# Patient Record
Sex: Female | Born: 1985 | Race: White | Hispanic: Refuse to answer | Marital: Married | Smoking: Never smoker
Health system: Southern US, Community
[De-identification: ages and names within clinical notes are randomized; demographics above are authoritative.]

## PROBLEM LIST (undated history)

## (undated) DIAGNOSIS — R87629 Unspecified abnormal cytological findings in specimens from vagina: Secondary | ICD-10-CM

## (undated) HISTORY — DX: Unspecified abnormal cytological findings in specimens from vagina: R87.629

---

## 2015-05-11 ENCOUNTER — Ambulatory Visit (INDEPENDENT_AMBULATORY_CARE_PROVIDER_SITE_OTHER): Payer: BLUE CROSS/BLUE SHIELD | Admitting: Adult Health

## 2015-05-11 ENCOUNTER — Encounter: Payer: Self-pay | Admitting: Adult Health

## 2015-05-11 ENCOUNTER — Other Ambulatory Visit (HOSPITAL_COMMUNITY)
Admission: RE | Admit: 2015-05-11 | Discharge: 2015-05-11 | Disposition: A | Discharge status: Home / Self Care | Payer: BLUE CROSS/BLUE SHIELD | Source: Ambulatory Visit | Attending: Adult Health | Admitting: Adult Health

## 2015-05-11 VITALS — BP 108/80 | HR 68 | Ht 64.0 in | Wt 136.0 lb

## 2015-05-11 DIAGNOSIS — Z1151 Encounter for screening for human papillomavirus (HPV): Secondary | ICD-10-CM | POA: Insufficient documentation

## 2015-05-11 DIAGNOSIS — Z01411 Encounter for gynecological examination (general) (routine) with abnormal findings: Secondary | ICD-10-CM | POA: Insufficient documentation

## 2015-05-11 DIAGNOSIS — Z01419 Encounter for gynecological examination (general) (routine) without abnormal findings: Secondary | ICD-10-CM | POA: Diagnosis not present

## 2015-05-11 NOTE — Patient Instructions (Signed)
Physical in 1 year Take PNV with folic acid

## 2015-05-11 NOTE — Progress Notes (Signed)
Patient ID: Marcello MooresCasey Severt, female   DOB: 01/21/1986, 30 y.o.   MRN: [id number] History of Present Illness: [name] is a 30 year old white female, married, G0P0, in for a well woman gyn exam and pap.She is not using birth control at present and says it is OK if gets pregnant. She is new to this practice and does not have PCP.  Current Medications, Allergies, Past Medical History, Past Surgical History, Family History and Social History were reviewed in [name] electronic medical record.     Review of Systems: Patient denies any headaches, hearing loss, fatigue, blurred vision, shortness of breath, chest pain, abdominal pain, problems with bowel movements, urination, or intercourse. No joint pain or mood swings.    Physical Exam:BP 108/80 mmHg  Pulse 68  Ht 5\' 4"  (1.626 m)  Wt 136 lb (61.689 kg)  BMI 23.33 kg/m2  LMP 04/20/2015 General:  Well developed, well nourished, no acute distress Skin:  Warm and dry Neck:  Midline trachea, normal thyroid, good ROM, no lymphadenopathy Lungs; Clear to auscultation bilaterally Breast:  No dominant palpable mass, retraction, or nipple discharge Cardiovascular: Regular rate and rhythm Abdomen:  Soft, non tender, no hepatosplenomegaly Pelvic:  External genitalia is normal in appearance, no lesions.  The vagina is normal in appearance. Urethra has no lesions or masses. The cervix is tiny, everted at os, Pap with HPV performed.  Uterus is felt to be normal size, shape, and contour.  No adnexal masses or tenderness noted.Bladder is non tender, no masses felt. Extremities/musculoskeletal:  No swelling or varicosities noted, no clubbing or cyanosis Psych:  No mood changes, alert and cooperative,seems happy She had labs at work.   Impression: Well woman gyn exam and pap     Plan:  Take folic acid 800 mcg daily Physical in 1 year, pap in 3 if normal, call with any questions or concerns before if needed Mammogram at 40

## 2015-05-12 LAB — CYTOLOGY - PAP

## 2016-05-15 ENCOUNTER — Encounter: Payer: Self-pay | Admitting: Adult Health

## 2016-05-15 ENCOUNTER — Ambulatory Visit (INDEPENDENT_AMBULATORY_CARE_PROVIDER_SITE_OTHER): Payer: BLUE CROSS/BLUE SHIELD | Admitting: Adult Health

## 2016-05-15 VITALS — BP 100/62 | HR 76 | Ht 64.0 in | Wt 137.0 lb

## 2016-05-15 DIAGNOSIS — Z13220 Encounter for screening for lipoid disorders: Secondary | ICD-10-CM | POA: Diagnosis not present

## 2016-05-15 DIAGNOSIS — Z01419 Encounter for gynecological examination (general) (routine) without abnormal findings: Secondary | ICD-10-CM

## 2016-05-15 MED ORDER — PRENATAL VITAMINS 0.8 MG PO TABS: 1.0000 | ORAL_TABLET | Freq: Every day | ORAL | 12 refills | Status: DC

## 2016-05-15 NOTE — Progress Notes (Signed)
Patient ID: Katherine Miles, female   DOB: [birth year], 31 y.o.   MRN: [id number] History of Present Illness:  Katherine Miles is a 31 year old white female, married in for well woman gyn exam, she had a normal pap with negative HPV 05/11/15.  Current Medications, Allergies, Past Medical History, Past Surgical History, Family History and Social History were reviewed in [facility] record.     Review of Systems: Patient denies any headaches, hearing loss, fatigue, blurred vision, shortness of breath, chest pain, abdominal pain, problems with bowel movements, urination, or intercourse. No joint pain or mood swings.She is not using birthcontrol and is OK if gets pregnant, is on OTC PNV.    Physical Exam:BP 100/62 (BP Location: Right Arm, Patient Position: Sitting, Cuff Size: Normal)   Pulse 76   Ht  (1.626 m)   Wt 137 lb (62.1 kg)   LMP 04/21/2016   BMI 23.52 kg/m  General:  Well developed, well nourished, no acute distress Skin:  Warm and dry Neck:  Midline trachea, normal thyroid, good ROM, no lymphadenopathy Lungs; Clear to auscultation bilaterally Breast:  No dominant palpable mass, retraction, or nipple discharge Cardiovascular: Regular rate and rhythm Abdomen:  Soft, non tender, no hepatosplenomegaly Pelvic:  External genitalia is normal in appearance, no lesions.  The vagina is normal in appearance. Urethra has no lesions or masses. The cervix is smooth.  Uterus is felt to be normal size, shape, and contour.  No adnexal masses or tenderness noted.Bladder is non tender, no masses felt. Extremities/musculoskeletal:  No swelling or varicosities noted, no clubbing or cyanosis Psych:  No mood changes, alert and cooperative,seems happy PHQ 2 score 0.  Impression:  1. Well woman exam with routine gynecological exam   2. Screening cholesterol level      Plan: Check CBC,CMP,TSH and lipids Physical in 1 year Pap in 2020

## 2016-05-16 LAB — CBC
Hematocrit: 40.6 % (ref 34.0–46.6)
Hemoglobin: 13.5 g/dL (ref 11.1–15.9)
MCH: 30.1 pg (ref 26.6–33.0)
MCHC: 33.3 g/dL (ref 31.5–35.7)
MCV: 90 fL (ref 79–97)
PLATELETS: 231 10*3/uL (ref 150–379)
RBC: 4.49 x10E6/uL (ref 3.77–5.28)
RDW: 13.1 % (ref 12.3–15.4)
WBC: 6.4 10*3/uL (ref 3.4–10.8)

## 2016-05-16 LAB — COMPREHENSIVE METABOLIC PANEL
A/G RATIO: 1.7 (ref 1.2–2.2)
ALK PHOS: 67 IU/L (ref 39–117)
ALT: 15 IU/L (ref 0–32)
AST: 18 IU/L (ref 0–40)
Albumin: 4.8 g/dL (ref 3.5–5.5)
BILIRUBIN TOTAL: 0.2 mg/dL (ref 0.0–1.2)
BUN/Creatinine Ratio: 20 (ref 9–23)
BUN: 15 mg/dL (ref 6–20)
CALCIUM: 9.9 mg/dL (ref 8.7–10.2)
CHLORIDE: 99 mmol/L (ref 96–106)
CO2: 24 mmol/L (ref 18–29)
Creatinine, Ser: 0.74 mg/dL (ref 0.57–1.00)
GFR calc Af Amer: 125 mL/min/{1.73_m2} (ref >59)
GFR calc non Af Amer: 108 mL/min/{1.73_m2} (ref >59)
Globulin, Total: 2.8 g/dL (ref 1.5–4.5)
Glucose: 85 mg/dL (ref 65–99)
POTASSIUM: 5.1 mmol/L (ref 3.5–5.2)
SODIUM: 140 mmol/L (ref 134–144)
Total Protein: 7.6 g/dL (ref 6.0–8.5)

## 2016-05-16 LAB — LIPID PANEL
CHOL/HDL RATIO: 2.2 ratio (ref 0.0–4.4)
CHOLESTEROL TOTAL: 199 mg/dL (ref 100–199)
HDL: 90 mg/dL (ref >39)
LDL CALC: 100 mg/dL — AB (ref 0–99)
Triglycerides: 47 mg/dL (ref 0–149)
VLDL CHOLESTEROL CAL: 9 mg/dL (ref 5–40)

## 2016-05-16 LAB — TSH: TSH: 1.67 u[IU]/mL (ref 0.450–4.500)

## 2016-05-29 ENCOUNTER — Telehealth: Payer: Self-pay | Admitting: *Deleted

## 2017-09-03 ENCOUNTER — Encounter: Payer: Self-pay | Admitting: Adult Health

## 2017-09-03 ENCOUNTER — Ambulatory Visit (INDEPENDENT_AMBULATORY_CARE_PROVIDER_SITE_OTHER): Payer: BLUE CROSS/BLUE SHIELD | Admitting: Adult Health

## 2017-09-03 VITALS — BP 113/67 | HR 68 | Ht 65.0 in | Wt 133.4 lb

## 2017-09-03 DIAGNOSIS — Z01419 Encounter for gynecological examination (general) (routine) without abnormal findings: Secondary | ICD-10-CM | POA: Diagnosis not present

## 2017-09-03 DIAGNOSIS — Z13220 Encounter for screening for lipoid disorders: Secondary | ICD-10-CM | POA: Insufficient documentation

## 2017-09-03 NOTE — Progress Notes (Signed)
Patient ID: Katherine Miles, female   DOB: 08/19/1985, 32 y.o.   MRN: [id number] History of Present Illness: Katherine Miles is a 32 year old white female,married in for well woman gyn exam, she had normal pap with negative HPV 05/11/15.   Current Medications, Allergies, Past Medical History, Past Surgical History, Family History and Social History were reviewed in [name] electronic medical record.     Review of Systems:  Patient denies any headaches, hearing loss, fatigue, blurred vision, shortness of breath, chest pain, abdominal pain, problems with bowel movements, urination, or intercourse. No joint pain or mood swings. She is not on birth control and if gets pregnant it is Ok, discussed timing of sex, but she would like to adopt.   Physical Exam:BP 113/67 (BP Location: Right Arm, Patient Position: Sitting, Cuff Size: Normal)   Pulse 68   Ht 5\' 5"  (1.651 m)   Wt 133 lb 6.4 oz (60.5 kg)   LMP 09/01/2017 (Exact Date)   BMI 22.20 kg/m  General:  Well developed, well nourished, no acute distress Skin:  Warm and dry Neck:  Midline trachea, normal thyroid, good ROM, no lymphadenopathy Lungs; Clear to auscultation bilaterally Breast:  No dominant palpable mass, retraction, or nipple discharge Cardiovascular: Regular rate and rhythm Abdomen:  Soft, non tender, no hepatosplenomegaly Pelvic:  External genitalia is normal in appearance, no lesions.  The vagina is normal in appearance,+blood. Urethra has no lesions or masses. The cervix is smooth.  Uterus is felt to be normal size, shape, and contour.  No adnexal masses or tenderness noted.Bladder is non tender, no masses felt. Extremities/musculoskeletal:  No swelling or varicosities noted, no clubbing or cyanosis Psych:  No mood changes, alert and cooperative,seems happy PHQ 2 score 0.  Impression: 1. Encounter for well woman exam with routine gynecological exam   2. Screening cholesterol level       Plan: Check CBC,CMP,TSH and lipids  Pap  and physical in 1 year

## 2017-09-04 LAB — COMPREHENSIVE METABOLIC PANEL
ALBUMIN: 4.9 g/dL (ref 3.5–5.5)
ALT: 12 IU/L (ref 0–32)
AST: 17 IU/L (ref 0–40)
Albumin/Globulin Ratio: 1.8 (ref 1.2–2.2)
Alkaline Phosphatase: 71 IU/L (ref 39–117)
BUN/Creatinine Ratio: 16 (ref 9–23)
BUN: 12 mg/dL (ref 6–20)
Bilirubin Total: <0.2 mg/dL (ref 0.0–1.2)
CALCIUM: 9.7 mg/dL (ref 8.7–10.2)
CO2: 23 mmol/L (ref 20–29)
CREATININE: 0.74 mg/dL (ref 0.57–1.00)
Chloride: 102 mmol/L (ref 96–106)
GFR, EST AFRICAN AMERICAN: 124 mL/min/{1.73_m2} (ref >59)
GFR, EST NON AFRICAN AMERICAN: 108 mL/min/{1.73_m2} (ref >59)
GLUCOSE: 77 mg/dL (ref 65–99)
Globulin, Total: 2.8 g/dL (ref 1.5–4.5)
Potassium: 4.3 mmol/L (ref 3.5–5.2)
Sodium: 141 mmol/L (ref 134–144)
TOTAL PROTEIN: 7.7 g/dL (ref 6.0–8.5)

## 2017-09-04 LAB — CBC
HEMOGLOBIN: 12.9 g/dL (ref 11.1–15.9)
Hematocrit: 38.4 % (ref 34.0–46.6)
MCH: 30.3 pg (ref 26.6–33.0)
MCHC: 33.6 g/dL (ref 31.5–35.7)
MCV: 90 fL (ref 79–97)
PLATELETS: 299 10*3/uL (ref 150–450)
RBC: 4.26 x10E6/uL (ref 3.77–5.28)
RDW: 12.3 % (ref 12.3–15.4)
WBC: 6.4 10*3/uL (ref 3.4–10.8)

## 2017-09-04 LAB — LIPID PANEL
CHOLESTEROL TOTAL: 182 mg/dL (ref 100–199)
Chol/HDL Ratio: 2.2 ratio (ref 0.0–4.4)
HDL: 84 mg/dL (ref >39)
LDL Calculated: 88 mg/dL (ref 0–99)
TRIGLYCERIDES: 51 mg/dL (ref 0–149)
VLDL CHOLESTEROL CAL: 10 mg/dL (ref 5–40)

## 2017-09-04 LAB — TSH: TSH: 2.12 u[IU]/mL (ref 0.450–4.500)

## 2017-10-15 ENCOUNTER — Ambulatory Visit (INDEPENDENT_AMBULATORY_CARE_PROVIDER_SITE_OTHER): Payer: BLUE CROSS/BLUE SHIELD | Admitting: Adult Health

## 2017-10-15 ENCOUNTER — Encounter

## 2017-10-15 ENCOUNTER — Encounter: Payer: Self-pay | Admitting: Adult Health

## 2017-10-15 VITALS — BP 123/74 | HR 78 | Ht 63.0 in | Wt 132.0 lb

## 2017-10-15 DIAGNOSIS — N926 Irregular menstruation, unspecified: Secondary | ICD-10-CM | POA: Diagnosis not present

## 2017-10-15 DIAGNOSIS — O3680X0 Pregnancy with inconclusive fetal viability, not applicable or unspecified: Secondary | ICD-10-CM

## 2017-10-15 DIAGNOSIS — Z3A01 Less than 8 weeks gestation of pregnancy: Secondary | ICD-10-CM

## 2017-10-15 DIAGNOSIS — Z3201 Encounter for pregnancy test, result positive: Secondary | ICD-10-CM | POA: Diagnosis not present

## 2017-10-15 LAB — POCT URINE PREGNANCY: Preg Test, Ur: POSITIVE — AB

## 2017-10-15 NOTE — Progress Notes (Signed)
  Subjective:     Patient ID: Taeja Patmon, female   DOB: 05/30/1985, 32 y.o.   MRN: [id number]  HPI Saarah is a 32 year old white female, married in for UPT, has missed a period and had 2+HPTs. Her mom is with her and excited.   Review of Systems +missed period with 2+HPTs Reviewed past medical,surgical, social and family history. Reviewed medications and allergies.     Objective:   Physical Exam BP 123/74 (BP Location: Left Arm, Patient Position: Sitting, Cuff Size: Normal)   Pulse 78   Ht 5\' 3"  (1.6 m)   Wt 132 lb (59.9 kg)   LMP 08/31/2017   BMI 23.38 kg/m UPT +, about 6+3 weeks by LMP with EDD 06/08/18.Skin warm and dry. Neck: mid line trachea, normal thyroid, good ROM, no lymphadenopathy noted. Lungs: clear to ausculation bilaterally. Cardiovascular: regular rate and rhythm. Abdomen is soft and non tender.     Assessment:     1. Pregnancy examination or test, positive result   2. Less than [redacted] weeks gestation of pregnancy   3. Encounter to determine fetal viability of pregnancy, single or unspecified fetus       Plan:     Continue PNV Dating [place] 9/17 at 7:30 am at [facility] Return in 3 weeks for new Ob visit Review handouts on First trimester and by [facility]

## 2017-10-15 NOTE — Patient Instructions (Signed)
First Trimester of Pregnancy The first trimester of pregnancy is from week 1 until the end of week 13 (months 1 through 3). A week after a sperm fertilizes an egg, the egg will implant on the wall of the uterus. This embryo will begin to develop into a baby. Genes from you and your partner will form the baby. The female genes will determine whether the baby will be a boy or a girl. At 6-8 weeks, the eyes and face will be formed, and the heartbeat can be seen on ultrasound. At the end of 12 weeks, all the baby's organs will be formed. Now that you are pregnant, you will want to do everything you can to have a healthy baby. Two of the most important things are to get good prenatal care and to follow your health care provider's instructions. Prenatal care is all the medical care you receive before the baby's birth. This care will help prevent, find, and treat any problems during the pregnancy and childbirth. Body changes during your first trimester Your body goes through many changes during pregnancy. The changes vary from woman to woman.  You may gain or lose a couple of pounds at first.  You may feel sick to your stomach (nauseous) and you may throw up (vomit). If the vomiting is uncontrollable, call your health care provider.  You may tire easily.  You may develop headaches that can be relieved by medicines. All medicines should be approved by your health care provider.  You may urinate more often. Painful urination may mean you have a bladder infection.  You may develop heartburn as a result of your pregnancy.  You may develop constipation because certain hormones are causing the muscles that push stool through your intestines to slow down.  You may develop hemorrhoids or swollen veins (varicose veins).  Your breasts may begin to grow larger and become tender. Your nipples may stick out more, and the tissue that surrounds them (areola) may become darker.  Your gums may bleed and may be  sensitive to brushing and flossing.  Dark spots or blotches (chloasma, mask of pregnancy) may develop on your face. This will likely fade after the baby is born.  Your menstrual periods will stop.  You may have a loss of appetite.  You may develop cravings for certain kinds of food.  You may have changes in your emotions from day to day, such as being excited to be pregnant or being concerned that something may go wrong with the pregnancy and baby.  You may have more vivid and strange dreams.  You may have changes in your hair. These can include thickening of your hair, rapid growth, and changes in texture. Some women also have hair loss during or after pregnancy, or hair that feels dry or thin. Your hair will most likely return to normal after your baby is born.  What to expect at prenatal visits During a routine prenatal visit:  You will be weighed to make sure you and the baby are growing normally.  Your blood pressure will be taken.  Your abdomen will be measured to track your baby's growth.  The fetal heartbeat will be listened to between weeks 10 and 14 of your pregnancy.  Test results from any previous visits will be discussed.  Your health care provider may ask you:  How you are feeling.  If you are feeling the baby move.  If you have had any abnormal symptoms, such as leaking fluid, bleeding, severe headaches,   or abdominal cramping.  If you are using any tobacco products, including cigarettes, chewing tobacco, and electronic cigarettes.  If you have any questions.  Other tests that may be performed during your first trimester include:  Blood tests to find your blood type and to check for the presence of any previous infections. The tests will also be used to check for low iron levels (anemia) and protein on red blood cells (Rh antibodies). Depending on your risk factors, or if you previously had diabetes during pregnancy, you may have tests to check for high blood  sugar that affects pregnant women (gestational diabetes).  Urine tests to check for infections, diabetes, or protein in the urine.  An ultrasound to confirm the proper growth and development of the baby.  Fetal screens for spinal cord problems (spina bifida) and Down syndrome.  HIV (human immunodeficiency virus) testing. Routine prenatal testing includes screening for HIV, unless you choose not to have this test.  You may need other tests to make sure you and the baby are doing well.  Follow these instructions at home: Medicines  Follow your health care provider's instructions regarding medicine use. Specific medicines may be either safe or unsafe to take during pregnancy.  Take a prenatal vitamin that contains at least 600 micrograms (mcg) of folic acid.  If you develop constipation, try taking a stool softener if your health care provider approves. Eating and drinking  Eat a balanced diet that includes fresh fruits and vegetables, whole grains, good sources of protein such as meat, eggs, or tofu, and low-fat dairy. Your health care provider will help you determine the amount of weight gain that is right for you.  Avoid raw meat and uncooked cheese. These carry germs that can cause birth defects in the baby.  Eating four or five small meals rather than three large meals a day may help relieve nausea and vomiting. If you start to feel nauseous, eating a few soda crackers can be helpful. Drinking liquids between meals, instead of during meals, also seems to help ease nausea and vomiting.  Limit foods that are high in fat and processed sugars, such as fried and sweet foods.  To prevent constipation: ? Eat foods that are high in fiber, such as fresh fruits and vegetables, whole grains, and beans. ? Drink enough fluid to keep your urine clear or pale yellow. Activity  Exercise only as directed by your health care provider. Most women can continue their usual exercise routine during  pregnancy. Try to exercise for 30 minutes at least 5 days a week. Exercising will help you: ? Control your weight. ? Stay in shape. ? Be prepared for labor and delivery.  Experiencing pain or cramping in the lower abdomen or lower back is a good sign that you should stop exercising. Check with your health care provider before continuing with normal exercises.  Try to avoid standing for long periods of time. Move your legs often if you must stand in one place for a long time.  Avoid heavy lifting.  Wear low-heeled shoes and practice good posture.  You may continue to have sex unless your health care provider tells you not to. Relieving pain and discomfort  Wear a good support bra to relieve breast tenderness.  Take warm sitz baths to soothe any pain or discomfort caused by hemorrhoids. Use hemorrhoid cream if your health care provider approves.  Rest with your legs elevated if you have leg cramps or low back pain.  If you develop   varicose veins in your legs, wear support hose. Elevate your feet for 15 minutes, 3-4 times a day. Limit salt in your diet. Prenatal care  Schedule your prenatal visits by the twelfth week of pregnancy. They are usually scheduled monthly at first, then more often in the last 2 months before delivery.  Write down your questions. Take them to your prenatal visits.  Keep all your prenatal visits as told by your health care provider. This is important. Safety  Wear your seat belt at all times when driving.  Make a list of emergency phone numbers, including numbers for family, friends, the hospital, and police and fire departments. General instructions  Ask your health care provider for a referral to a local prenatal education class. Begin classes no later than the beginning of month 6 of your pregnancy.  Ask for help if you have counseling or nutritional needs during pregnancy. Your health care provider can offer advice or refer you to specialists for help  with various needs.  Do not use hot tubs, steam rooms, or saunas.  Do not douche or use tampons or scented sanitary pads.  Do not cross your legs for long periods of time.  Avoid cat litter boxes and soil used by cats. These carry germs that can cause birth defects in the baby and possibly loss of the fetus by miscarriage or stillbirth.  Avoid all smoking, herbs, alcohol, and medicines not prescribed by your health care provider. Chemicals in these products affect the formation and growth of the baby.  Do not use any products that contain nicotine or tobacco, such as cigarettes and e-cigarettes. If you need help quitting, ask your health care provider. You may receive counseling support and other resources to help you quit.  Schedule a dentist appointment. At home, brush your teeth with a soft toothbrush and be gentle when you floss. Contact a health care provider if:  You have dizziness.  You have mild pelvic cramps, pelvic pressure, or nagging pain in the abdominal area.  You have persistent nausea, vomiting, or diarrhea.  You have a bad smelling vaginal discharge.  You have pain when you urinate.  You notice increased swelling in your face, hands, legs, or ankles.  You are exposed to fifth disease or chickenpox.  You are exposed to German measles (rubella) and have never had it. Get help right away if:  You have a fever.  You are leaking fluid from your vagina.  You have spotting or bleeding from your vagina.  You have severe abdominal cramping or pain.  You have rapid weight gain or loss.  You vomit blood or material that looks like coffee grounds.  You develop a severe headache.  You have shortness of breath.  You have any kind of trauma, such as from a fall or a car accident. Summary  The first trimester of pregnancy is from week 1 until the end of week 13 (months 1 through 3).  Your body goes through many changes during pregnancy. The changes vary from  woman to woman.  You will have routine prenatal visits. During those visits, your health care provider will examine you, discuss any test results you may have, and talk with you about how you are feeling. This information is not intended to replace advice given to you by your health care provider. Make sure you discuss any questions you have with your health care provider. Document Released: 01/16/2001 Document Revised: 01/04/2016 Document Reviewed: 01/04/2016 [employer] Interactive Patient Education  2018 [employer]  

## 2017-10-22 ENCOUNTER — Ambulatory Visit (HOSPITAL_COMMUNITY)
Admission: RE | Admit: 2017-10-22 | Discharge: 2017-10-22 | Disposition: A | Discharge status: Home / Self Care | Payer: BLUE CROSS/BLUE SHIELD | Source: Ambulatory Visit | Attending: Adult Health | Admitting: Adult Health

## 2017-10-22 DIAGNOSIS — Z3A01 Less than 8 weeks gestation of pregnancy: Secondary | ICD-10-CM | POA: Diagnosis not present

## 2017-10-22 DIAGNOSIS — O3680X0 Pregnancy with inconclusive fetal viability, not applicable or unspecified: Secondary | ICD-10-CM | POA: Diagnosis not present

## 2017-11-18 ENCOUNTER — Ambulatory Visit (INDEPENDENT_AMBULATORY_CARE_PROVIDER_SITE_OTHER): Payer: BLUE CROSS/BLUE SHIELD | Admitting: Women's Health

## 2017-11-18 ENCOUNTER — Ambulatory Visit: Payer: BLUE CROSS/BLUE SHIELD | Admitting: *Deleted

## 2017-11-18 ENCOUNTER — Encounter: Payer: Self-pay | Admitting: Women's Health

## 2017-11-18 VITALS — BP 115/61 | HR 86 | Wt 136.0 lb

## 2017-11-18 DIAGNOSIS — Z3A11 11 weeks gestation of pregnancy: Secondary | ICD-10-CM

## 2017-11-18 DIAGNOSIS — Z3401 Encounter for supervision of normal first pregnancy, first trimester: Secondary | ICD-10-CM

## 2017-11-18 DIAGNOSIS — Z1389 Encounter for screening for other disorder: Secondary | ICD-10-CM

## 2017-11-18 DIAGNOSIS — Z363 Encounter for antenatal screening for malformations: Secondary | ICD-10-CM

## 2017-11-18 DIAGNOSIS — Z3400 Encounter for supervision of normal first pregnancy, unspecified trimester: Secondary | ICD-10-CM | POA: Insufficient documentation

## 2017-11-18 DIAGNOSIS — Z331 Pregnant state, incidental: Secondary | ICD-10-CM

## 2017-11-18 LAB — POCT URINALYSIS DIPSTICK OB
Blood, UA: NEGATIVE
GLUCOSE, UA: NEGATIVE
KETONES UA: NEGATIVE
Leukocytes, UA: NEGATIVE
Nitrite, UA: NEGATIVE
POC,PROTEIN,UA: NEGATIVE

## 2017-11-18 MED ORDER — PROMETHAZINE HCL 25 MG PO TABS: 12.5000 mg | ORAL_TABLET | Freq: Four times a day (QID) | ORAL | 0 refills | Status: DC | PRN

## 2017-11-18 NOTE — Patient Instructions (Addendum)
Katherine Miles, I greatly value your feedback.  If you receive a survey following your visit with [place] today, we appreciate you taking the time to fill it out.  Thanks, Joellyn Haff, CNM, WHNP-BC  Optimized schedule: 18 weeks (anatomy ultrasound), 26wks (sugar test), 30wks, 34wks, 37wks, then weekly   Nausea & Vomiting  Have saltine crackers or pretzels by your bed and eat a few bites before you raise your head out of bed in the morning  Eat small frequent meals throughout the day instead of large meals  Drink plenty of fluids throughout the day to stay hydrated, just don't drink a lot of fluids with your meals.  This can make your stomach fill up faster making you feel sick  Do not brush your teeth right after you eat  Products with real ginger are good for nausea, like ginger ale and ginger hard candy Make sure it says made with real ginger!  Sucking on sour candy like lemon heads is also good for nausea  If your prenatal vitamins make you nauseated, take them at night so you will sleep through the nausea  Sea Bands  If you feel like you need medicine for the nausea & vomiting please let us know  If you are unable to keep any fluids or food down please let us know   Constipation  Drink plenty of fluid, preferably water, throughout the day  Eat foods high in fiber such as fruits, vegetables, and grains  Exercise, such as walking, is a good way to keep your bowels regular  Drink warm fluids, especially warm prune juice, or decaf coffee  Eat a 1/2 cup of real oatmeal (not instant), 1/2 cup applesauce, and 1/2-1 cup warm prune juice every day  If needed, you may take Colace (docusate sodium) stool softener once or twice a day to help keep the stool soft. If you are pregnant, wait until you are out of your first trimester (12-14 weeks of pregnancy)  If you still are having problems with constipation, you may take Miralax once daily as needed to help keep your bowels regular.  If you  are pregnant, wait until you are out of your first trimester (12-14 weeks of pregnancy)   First Trimester of Pregnancy The first trimester of pregnancy is from week 1 until the end of week 12 (months 1 through 3). A week after a sperm fertilizes an egg, the egg will implant on the wall of the uterus. This embryo will begin to develop into a baby. Genes from you and your partner are forming the baby. The female genes determine whether the baby is a boy or a girl. At 6-8 weeks, the eyes and face are formed, and the heartbeat can be seen on ultrasound. At the end of 12 weeks, all the baby's organs are formed.  Now that you are pregnant, you will want to do everything you can to have a healthy baby. Two of the most important things are to get good prenatal care and to follow your health care provider's instructions. Prenatal care is all the medical care you receive before the baby's birth. This care will help prevent, find, and treat any problems during the pregnancy and childbirth. BODY CHANGES Your body goes through many changes during pregnancy. The changes vary from woman to woman.   You may gain or lose a couple of pounds at first.  You may feel sick to your stomach (nauseous) and throw up (vomit). If the vomiting is uncontrollable, call  your health care provider.  You may tire easily.  You may develop headaches that can be relieved by medicines approved by your health care provider.  You may urinate more often. Painful urination may mean you have a bladder infection.  You may develop heartburn as a result of your pregnancy.  You may develop constipation because certain hormones are causing the muscles that push waste through your intestines to slow down.  You may develop hemorrhoids or swollen, bulging veins (varicose veins).  Your breasts may begin to grow larger and become tender. Your nipples may stick out more, and the tissue that surrounds them (areola) may become darker.  Your gums  may bleed and may be sensitive to brushing and flossing.  Dark spots or blotches (chloasma, mask of pregnancy) may develop on your face. This will likely fade after the baby is born.  Your menstrual periods will stop.  You may have a loss of appetite.  You may develop cravings for certain kinds of food.  You may have changes in your emotions from day to day, such as being excited to be pregnant or being concerned that something may go wrong with the pregnancy and baby.  You may have more vivid and strange dreams.  You may have changes in your hair. These can include thickening of your hair, rapid growth, and changes in texture. Some women also have hair loss during or after pregnancy, or hair that feels dry or thin. Your hair will most likely return to normal after your baby is born. WHAT TO EXPECT AT YOUR PRENATAL VISITS During a routine prenatal visit:  You will be weighed to make sure you and the baby are growing normally.  Your blood pressure will be taken.  Your abdomen will be measured to track your baby's growth.  The fetal heartbeat will be listened to starting around week 10 or 12 of your pregnancy.  Test results from any previous visits will be discussed. Your health care provider may ask you:  How you are feeling.  If you are feeling the baby move.  If you have had any abnormal symptoms, such as leaking fluid, bleeding, severe headaches, or abdominal cramping.  If you have any questions. Other tests that may be performed during your first trimester include:  Blood tests to find your blood type and to check for the presence of any previous infections. They will also be used to check for low iron levels (anemia) and Rh antibodies. Later in the pregnancy, blood tests for diabetes will be done along with other tests if problems develop.  Urine tests to check for infections, diabetes, or protein in the urine.  An ultrasound to confirm the proper growth and development  of the baby.  An amniocentesis to check for possible genetic problems.  Fetal screens for spina bifida and Down syndrome.  You may need other tests to make sure you and the baby are doing well. HOME CARE INSTRUCTIONS  Medicines  Follow your health care provider's instructions regarding medicine use. Specific medicines may be either safe or unsafe to take during pregnancy.  Take your prenatal vitamins as directed.  If you develop constipation, try taking a stool softener if your health care provider approves. Diet  Eat regular, well-balanced meals. Choose a variety of foods, such as meat or vegetable-based protein, fish, milk and low-fat dairy products, vegetables, fruits, and whole grain breads and cereals. Your health care provider will help you determine the amount of weight gain that is right  for you.  Avoid raw meat and uncooked cheese. These carry germs that can cause birth defects in the baby.  Eating four or five small meals rather than three large meals a day may help relieve nausea and vomiting. If you start to feel nauseous, eating a few soda crackers can be helpful. Drinking liquids between meals instead of during meals also seems to help nausea and vomiting.  If you develop constipation, eat more high-fiber foods, such as fresh vegetables or fruit and whole grains. Drink enough fluids to keep your urine clear or pale yellow. Activity and Exercise  Exercise only as directed by your health care provider. Exercising will help you:  Control your weight.  Stay in shape.  Be prepared for labor and delivery.  Experiencing pain or cramping in the lower abdomen or low back is a good sign that you should stop exercising. Check with your health care provider before continuing normal exercises.  Try to avoid standing for long periods of time. Move your legs often if you must stand in one place for a long time.  Avoid heavy lifting.  Wear low-heeled shoes, and practice good  posture.  You may continue to have sex unless your health care provider directs you otherwise. Relief of Pain or Discomfort  Wear a good support bra for breast tenderness.   Take warm sitz baths to soothe any pain or discomfort caused by hemorrhoids. Use hemorrhoid cream if your health care provider approves.   Rest with your legs elevated if you have leg cramps or low back pain.  If you develop varicose veins in your legs, wear support hose. Elevate your feet for 15 minutes, 3-4 times a day. Limit salt in your diet. Prenatal Care  Schedule your prenatal visits by the twelfth week of pregnancy. They are usually scheduled monthly at first, then more often in the last 2 months before delivery.  Write down your questions. Take them to your prenatal visits.  Keep all your prenatal visits as directed by your health care provider. Safety  Wear your seat belt at all times when driving.  Make a list of emergency phone numbers, including numbers for family, friends, the hospital, and police and fire departments. General Tips  Ask your health care provider for a referral to a local prenatal education class. Begin classes no later than at the beginning of month 6 of your pregnancy.  Ask for help if you have counseling or nutritional needs during pregnancy. Your health care provider can offer advice or refer you to specialists for help with various needs.  Do not use hot tubs, steam rooms, or saunas.  Do not douche or use tampons or scented sanitary pads.  Do not cross your legs for long periods of time.  Avoid cat litter boxes and soil used by cats. These carry germs that can cause birth defects in the baby and possibly loss of the fetus by miscarriage or stillbirth.  Avoid all smoking, herbs, alcohol, and medicines not prescribed by your health care provider. Chemicals in these affect the formation and growth of the baby.  Schedule a dentist appointment. At home, brush your teeth with  a soft toothbrush and be gentle when you floss. SEEK MEDICAL CARE IF:   You have dizziness.  You have mild pelvic cramps, pelvic pressure, or nagging pain in the abdominal area.  You have persistent nausea, vomiting, or diarrhea.  You have a bad smelling vaginal discharge.  You have pain with urination.  You notice  increased swelling in your face, hands, legs, or ankles. SEEK IMMEDIATE MEDICAL CARE IF:   You have a fever.  You are leaking fluid from your vagina.  You have spotting or bleeding from your vagina.  You have severe abdominal cramping or pain.  You have rapid weight gain or loss.  You vomit blood or material that looks like coffee grounds.  You are exposed to [place] measles and have never had them.  You are exposed to fifth disease or chickenpox.  You develop a severe headache.  You have shortness of breath.  You have any kind of trauma, such as from a fall or a car accident. Document Released: 01/16/2001 Document Revised: 06/08/2013 Document Reviewed: 12/02/2012 [facility] Patient Information 2015 [place], [place]. This information is not intended to replace advice given to you by your health care provider. Make sure you discuss any questions you have with your health care provider.   Second Trimester of Pregnancy The second trimester is from week 14 through week 27 (months 4 through 6). The second trimester is often a time when you feel your best. Your body has adjusted to being pregnant, and you begin to feel better physically. Usually, morning sickness has lessened or quit completely, you may have more energy, and you may have an increase in appetite. The second trimester is also a time when the fetus is growing rapidly. At the end of the sixth month, the fetus is about 9 inches long and weighs about 1 pounds. You will likely begin to feel the baby move (quickening) between 16 and 20 weeks of pregnancy. Body changes during your second trimester Your body  continues to go through many changes during your second trimester. The changes vary from woman to woman.  Your weight will continue to increase. You will notice your lower abdomen bulging out.  You may begin to get stretch marks on your hips, abdomen, and breasts.  You may develop headaches that can be relieved by medicines. The medicines should be approved by your health care provider.  You may urinate more often because the fetus is pressing on your bladder.  You may develop or continue to have heartburn as a result of your pregnancy.  You may develop constipation because certain hormones are causing the muscles that push waste through your intestines to slow down.  You may develop hemorrhoids or swollen, bulging veins (varicose veins).  You may have back pain. This is caused by: ? Weight gain. ? Pregnancy hormones that are relaxing the joints in your pelvis. ? A shift in weight and the muscles that support your balance.  Your breasts will continue to grow and they will continue to become tender.  Your gums may bleed and may be sensitive to brushing and flossing.  Dark spots or blotches (chloasma, mask of pregnancy) may develop on your face. This will likely fade after the baby is born.  A dark line from your belly button to the pubic area (linea nigra) may appear. This will likely fade after the baby is born.  You may have changes in your hair. These can include thickening of your hair, rapid growth, and changes in texture. Some women also have hair loss during or after pregnancy, or hair that feels dry or thin. Your hair will most likely return to normal after your baby is born.  What to expect at prenatal visits During a routine prenatal visit:  You will be weighed to make sure you and the fetus are growing normally.  Your blood pressure will be taken.  Your abdomen will be measured to track your baby's growth.  The fetal heartbeat will be listened to.  Any test results  from the previous visit will be discussed.  Your health care provider may ask you:  How you are feeling.  If you are feeling the baby move.  If you have had any abnormal symptoms, such as leaking fluid, bleeding, severe headaches, or abdominal cramping.  If you are using any tobacco products, including cigarettes, chewing tobacco, and electronic cigarettes.  If you have any questions.  Other tests that may be performed during your second trimester include:  Blood tests that check for: ? Low iron levels (anemia). ? High blood sugar that affects pregnant women (gestational diabetes) between 105 and 28 weeks. ? Rh antibodies. This is to check for a protein on red blood cells (Rh factor).  Urine tests to check for infections, diabetes, or protein in the urine.  An ultrasound to confirm the proper growth and development of the baby.  An amniocentesis to check for possible genetic problems.  Fetal screens for spina bifida and Down syndrome.  HIV (human immunodeficiency virus) testing. Routine prenatal testing includes screening for HIV, unless you choose not to have this test.  Follow these instructions at home: Medicines  Follow your health care provider's instructions regarding medicine use. Specific medicines may be either safe or unsafe to take during pregnancy.  Take a prenatal vitamin that contains at least 600 micrograms (mcg) of folic acid.  If you develop constipation, try taking a stool softener if your health care provider approves. Eating and drinking  Eat a balanced diet that includes fresh fruits and vegetables, whole grains, good sources of protein such as meat, eggs, or tofu, and low-fat dairy. Your health care provider will help you determine the amount of weight gain that is right for you.  Avoid raw meat and uncooked cheese. These carry germs that can cause birth defects in the baby.  If you have low calcium intake from food, talk to your health care provider  about whether you should take a daily calcium supplement.  Limit foods that are high in fat and processed sugars, such as fried and sweet foods.  To prevent constipation: ? Drink enough fluid to keep your urine clear or pale yellow. ? Eat foods that are high in fiber, such as fresh fruits and vegetables, whole grains, and beans. Activity  Exercise only as directed by your health care provider. Most women can continue their usual exercise routine during pregnancy. Try to exercise for 30 minutes at least 5 days a week. Stop exercising if you experience uterine contractions.  Avoid heavy lifting, wear low heel shoes, and practice good posture.  A sexual relationship may be continued unless your health care provider directs you otherwise. Relieving pain and discomfort  Wear a good support bra to prevent discomfort from breast tenderness.  Take warm sitz baths to soothe any pain or discomfort caused by hemorrhoids. Use hemorrhoid cream if your health care provider approves.  Rest with your legs elevated if you have leg cramps or low back pain.  If you develop varicose veins, wear support hose. Elevate your feet for 15 minutes, 3-4 times a day. Limit salt in your diet. Prenatal Care  Write down your questions. Take them to your prenatal visits.  Keep all your prenatal visits as told by your health care provider. This is important. Safety  Wear your seat belt at all times  when driving.  Make a list of emergency phone numbers, including numbers for family, friends, the hospital, and police and fire departments. General instructions  Ask your health care provider for a referral to a local prenatal education class. Begin classes no later than the beginning of month 6 of your pregnancy.  Ask for help if you have counseling or nutritional needs during pregnancy. Your health care provider can offer advice or refer you to specialists for help with various needs.  Do not use hot tubs, steam  rooms, or saunas.  Do not douche or use tampons or scented sanitary pads.  Do not cross your legs for long periods of time.  Avoid cat litter boxes and soil used by cats. These carry germs that can cause birth defects in the baby and possibly loss of the fetus by miscarriage or stillbirth.  Avoid all smoking, herbs, alcohol, and unprescribed drugs. Chemicals in these products can affect the formation and growth of the baby.  Do not use any products that contain nicotine or tobacco, such as cigarettes and e-cigarettes. If you need help quitting, ask your health care provider.  Visit your dentist if you have not gone yet during your pregnancy. Use a soft toothbrush to brush your teeth and be gentle when you floss. Contact a health care provider if:  You have dizziness.  You have mild pelvic cramps, pelvic pressure, or nagging pain in the abdominal area.  You have persistent nausea, vomiting, or diarrhea.  You have a bad smelling vaginal discharge.  You have pain when you urinate. Get help right away if:  You have a fever.  You are leaking fluid from your vagina.  You have spotting or bleeding from your vagina.  You have severe abdominal cramping or pain.  You have rapid weight gain or weight loss.  You have shortness of breath with chest pain.  You notice sudden or extreme swelling of your face, hands, ankles, feet, or legs.  You have not felt your baby move in over an hour.  You have severe headaches that do not go away when you take medicine.  You have vision changes. Summary  The second trimester is from week 14 through week 27 (months 4 through 6). It is also a time when the fetus is growing rapidly.  Your body goes through many changes during pregnancy. The changes vary from woman to woman.  Avoid all smoking, herbs, alcohol, and unprescribed drugs. These chemicals affect the formation and growth your baby.  Do not use any tobacco products, such as cigarettes,  chewing tobacco, and e-cigarettes. If you need help quitting, ask your health care provider.  Contact your health care provider if you have any questions. Keep all prenatal visits as told by your health care provider. This is important. This information is not intended to replace advice given to you by your health care provider. Make sure you discuss any questions you have with your health care provider. Document Released: 01/16/2001 Document Revised: 06/30/2015 Document Reviewed: 03/25/2012 [employer] Interactive Patient Education  2017 [employer]  PROTECT YOURSELF & YOUR BABY FROM THE FLU! Because you are pregnant, we at [facility], along with the Centers for Disease Control (CDC), recommend that you receive the flu vaccine to protect yourself and your baby from the flu. The flu is more likely to cause severe illness in pregnant women than in women of reproductive age who are not pregnant. Changes in the immune system, heart, and lungs during pregnancy make pregnant  women (and women up to two weeks postpartum) more prone to severe illness from flu, including illness resulting in hospitalization. Flu also may be harmful for a pregnant woman's developing baby. A common flu symptom is fever, which may be associated with neural tube defects and other adverse outcomes for a developing baby. Getting vaccinated can also help protect a baby after birth from flu. (Mom passes antibodies onto the developing baby during her pregnancy.)  A Flu Vaccine is the Best Protection Against Flu Getting a flu vaccine is the first and most important step in protecting against flu. Pregnant women should get a flu shot and not the live attenuated influenza vaccine (LAIV), also known as nasal spray flu vaccine. Flu vaccines given during pregnancy help protect both the mother and her baby from flu. Vaccination has been shown to reduce the risk of flu-associated acute respiratory infection in pregnant women by up to one-half. A  2018 study showed that getting a flu shot reduced a pregnant woman's risk of being hospitalized with flu by an average of 40 percent. Pregnant women who get a flu vaccine are also helping to protect their babies from flu illness for the first several months after their birth, when they are too young to get vaccinated.   A Long Record of Safety for Flu Shots in Pregnant Women Flu shots have been given to millions of pregnant women over many years with a good safety record. There is a lot of evidence that flu vaccines can be given safely during pregnancy; though these data are limited for the first trimester. The CDC recommends that pregnant women get vaccinated during any trimester of their pregnancy. It is very important for pregnant women to get the flu shot.   Other Preventive Actions In addition to getting a flu shot, pregnant women should take the same everyday preventive actions the CDC recommends of everyone, including covering coughs, washing hands often, and avoiding people who are sick.  Symptoms and Treatment If you get sick with flu symptoms call your doctor right away. There are antiviral drugs that can treat flu illness and prevent serious flu complications. The CDC recommends prompt treatment for people who have influenza infection or suspected influenza infection and who are at high risk of serious flu complications, such as people with asthma, diabetes (including gestational diabetes), or heart disease. Early treatment of influenza in hospitalized pregnant women has been shown to reduce the length of the hospital stay.  Symptoms Flu symptoms include fever, cough, sore throat, runny or stuffy nose, body aches, headache, chills and fatigue. Some people may also have vomiting and diarrhea. People may be infected with the flu and have respiratory symptoms without a fever.  Early Treatment is Important for Pregnant Women Treatment should begin as soon as possible because antiviral drugs  work best when started early (within 48 hours after symptoms start). Antiviral drugs can make your flu illness milder and make you feel better faster. They may also prevent serious health problems that can result from flu illness. Oral oseltamivir (Tamiflu) is the preferred treatment for pregnant women because it has the most studies available to suggest that it is safe and beneficial. Antiviral drugs require a prescription from your provider. Having a fever caused by flu infection or other infections early in pregnancy may be linked to birth defects in a baby. In addition to taking antiviral drugs, pregnant women who get a fever should treat their fever with Tylenol (acetaminophen) and contact their provider immediately.  When  to Seek Emergency Medical Care If you are pregnant and have any of these signs, seek care immediately:  Difficulty breathing or shortness of breath  Pain or pressure in the chest or abdomen  Sudden dizziness  Confusion  Severe or persistent vomiting  High fever that is not responding to Tylenol (or store brand equivalent)  Decreased or no movement of your baby  [email or web address]

## 2017-11-18 NOTE — Progress Notes (Signed)
INITIAL OBSTETRICAL VISIT Patient name: Katherine Miles MRN [id number]  Date of birth: May 28, 1985 Chief Complaint:   Initial Prenatal Visit  History of Present Illness:   Katherine Miles is a 32 y.o. G2P0000 Caucasian female at [redacted]w[redacted]d by LMP c/w 6wk u/s, with an Estimated Date of Delivery: 06/08/18 being seen today for her initial obstetrical visit.   Her obstetrical history is significant for primigravida.  Going to [place] on trip tomorrow, wants rx for nausea (no Bonjesta samples here). May switch to [facility] d/t location Today she reports nausea, no vomiting. Undecided if she will stay with [place] for prenatal care or go to possibly [place], doesn't like that a provider from our office is not guaranteed to be there for labor/birth, also concerned about 1hr drive to [facility] from her house. Patient's last menstrual period was 09/01/2017 (exact date). Last pap 05/11/15. Results were: normal Review of Systems:   Pertinent items are noted in HPI Denies cramping/contractions, leakage of fluid, vaginal bleeding, abnormal vaginal discharge w/ itching/odor/irritation, headaches, visual changes, shortness of breath, chest pain, abdominal pain, severe nausea/vomiting, or problems with urination or bowel movements unless otherwise stated above.  Pertinent History Reviewed:  Reviewed past medical,surgical, social, obstetrical and family history.  Reviewed problem list, medications and allergies. OB History  Gravida Para Term Preterm AB Living  1 0 0 0 0 0  SAB TAB Ectopic Multiple Live Births  0 0 0 0      # Outcome Date GA Lbr Len/2nd Weight Sex Delivery Anes PTL Lv  1 Current            Physical Assessment:   Vitals:   11/18/17 1523  BP: 115/61  Pulse: 86  Weight: 136 lb (61.7 kg)  Body mass index is 24.09 kg/m.       Physical Examination:  General appearance - well appearing, and in no distress  Mental status - alert, oriented to person, place, and time  Psych:  She has a normal mood and affect  Skin - warm and  dry, normal color, no suspicious lesions noted  Chest - effort normal, all lung fields clear to auscultation bilaterally  Heart - normal rate and regular rhythm  Abdomen - soft, nontender  Extremities:  No swelling or varicosities noted  Thin prep pap is not done  Fetal Heart Rate (bpm): [id number] via doppler  No results found for this or any previous visit (from the past 24 hour(s)).  Assessment & Plan:  1) Low-Risk Pregnancy G1P0000 at [redacted]w[redacted]d with an Estimated Date of Delivery: 06/08/18   2) Initial OB visit  3) Nausea> offered Bonjesta rx, no samples available (declines rx right now), rx phenergan to have on hand if needed during upcoming trip. Gave printed relief measures  Meds:  Meds ordered this encounter  Medications  . promethazine (PHENERGAN) 25 MG tablet    Sig: Take 0.5-1 tablets (12.5-25 mg total) by mouth every 6 (six) hours as needed for nausea or vomiting.    Dispense:  30 tablet    Refill:  0    Order Specific Question:   Supervising Provider    Answer:   Duane Lope H [[id number]]    Initial labs obtained Continue prenatal vitamins Reviewed n/v relief measures and warning s/s to report Reviewed recommended weight gain based on pre-gravid BMI Encouraged well-balanced diet Genetic Screening discussed Integrated Screen: declined Cystic fibrosis screening discussed declined Ultrasound discussed; fetal survey: requested CCNC completed>not sent, not applying for preg mcaid Signed up for Baby[employer]s optimized  per pt preference, visits will be at 18, 26, 30, 34, 37wks then weekly  Follow-up: 7wks for LROB and anatomy u/s  Orders Placed This Encounter  Procedures  . GC/Chlamydia Probe Amp  . Urine Culture  . US OB Comp + 14 Wk  . Obstetric Panel, Including HIV  . Urinalysis, Routine w reflex microscopic  . Pain Management Screening Profile (10S)  . POC Urinalysis Dipstick OB    Cheral Marker CNM, [facility] 11/18/17

## 2017-11-19 LAB — OBSTETRIC PANEL, INCLUDING HIV
ANTIBODY SCREEN: NEGATIVE
Basophils Absolute: 0 10*3/uL (ref 0.0–0.2)
Basos: 0 %
EOS (ABSOLUTE): 0.1 10*3/uL (ref 0.0–0.4)
EOS: 1 %
HEMOGLOBIN: 13.1 g/dL (ref 11.1–15.9)
HEP B S AG: NEGATIVE
HIV SCREEN 4TH GENERATION: NONREACTIVE
Hematocrit: 37 % (ref 34.0–46.6)
IMMATURE GRANS (ABS): 0.1 10*3/uL (ref 0.0–0.1)
Immature Granulocytes: 1 %
LYMPHS ABS: 2.2 10*3/uL (ref 0.7–3.1)
Lymphs: 17 %
MCH: 30.8 pg (ref 26.6–33.0)
MCHC: 35.4 g/dL (ref 31.5–35.7)
MCV: 87 fL (ref 79–97)
MONOS ABS: 0.7 10*3/uL (ref 0.1–0.9)
Monocytes: 5 %
NEUTROS ABS: 9.7 10*3/uL — AB (ref 1.4–7.0)
Neutrophils: 76 %
PLATELETS: 282 10*3/uL (ref 150–450)
RBC: 4.26 x10E6/uL (ref 3.77–5.28)
RDW: 12.5 % (ref 12.3–15.4)
RH TYPE: NEGATIVE
RPR: NONREACTIVE
RUBELLA: 8.14 {index} (ref >0.99)
WBC: 12.7 10*3/uL — ABNORMAL HIGH (ref 3.4–10.8)

## 2017-11-19 LAB — PMP SCREEN PROFILE (10S), URINE
AMPHETAMINE SCREEN URINE: NEGATIVE ng/mL
BARBITURATE SCREEN URINE: NEGATIVE ng/mL
BENZODIAZEPINE SCREEN, URINE: NEGATIVE ng/mL
CANNABINOIDS UR QL SCN: NEGATIVE ng/mL
COCAINE(METAB.)SCREEN, URINE: NEGATIVE ng/mL
Creatinine(Crt), U: 126.9 mg/dL (ref 20.0–300.0)
METHADONE SCREEN, URINE: NEGATIVE ng/mL
OXYCODONE+OXYMORPHONE UR QL SCN: NEGATIVE ng/mL
Opiate Scrn, Ur: NEGATIVE ng/mL
PH UR, DRUG SCRN: 7 (ref 4.5–8.9)
PHENCYCLIDINE QUANTITATIVE URINE: NEGATIVE ng/mL
PROPOXYPHENE SCREEN URINE: NEGATIVE ng/mL

## 2017-11-19 LAB — URINALYSIS, ROUTINE W REFLEX MICROSCOPIC
Bilirubin, UA: NEGATIVE
Glucose, UA: NEGATIVE
KETONES UA: NEGATIVE
LEUKOCYTES UA: NEGATIVE
Nitrite, UA: NEGATIVE
PH UA: 7 (ref 5.0–7.5)
Protein, UA: NEGATIVE
RBC, UA: NEGATIVE
SPEC GRAV UA: 1.025 (ref 1.005–1.030)
Urobilinogen, Ur: 0.2 mg/dL (ref 0.2–1.0)

## 2017-11-20 ENCOUNTER — Encounter: Payer: Self-pay | Admitting: Women's Health

## 2017-11-20 DIAGNOSIS — Z6791 Unspecified blood type, Rh negative: Secondary | ICD-10-CM | POA: Insufficient documentation

## 2017-11-20 DIAGNOSIS — O26899 Other specified pregnancy related conditions, unspecified trimester: Secondary | ICD-10-CM

## 2017-11-20 LAB — GC/CHLAMYDIA PROBE AMP
Chlamydia trachomatis, NAA: NEGATIVE
NEISSERIA GONORRHOEAE BY PCR: NEGATIVE

## 2017-11-20 LAB — URINE CULTURE

## 2018-02-05 NOTE — L&D Delivery Note (Signed)
Delivery Note At 11:06 AM a viable and healthy female was delivered via Vaginal, Spontaneous (Presentation:LOA  ).  APGAR: 9, 9; weight 7 lb 15.7 oz (3620 g).   Placenta status: spontaneous, intact.  Cord:  with the following complications: none.  Cord pH: na  Anesthesia:  epidural Episiotomy: None Lacerations: 2nd degree Suture Repair: 2.0 vicryl rapide Est. Blood Loss (mL): 136  Mom to postpartum.  Baby to Couplet care / Skin to Skin.  Ashely Goosby J 06/04/2018, 12:15 PM

## 2018-04-09 NOTE — Telephone Encounter (Signed)
Note sent to nurse. 

## 2018-05-15 LAB — OB RESULTS CONSOLE GBS: GBS: POSITIVE

## 2018-05-22 ENCOUNTER — Encounter (HOSPITAL_COMMUNITY): Payer: Self-pay

## 2018-05-22 ENCOUNTER — Other Ambulatory Visit: Payer: Self-pay

## 2018-05-22 ENCOUNTER — Inpatient Hospital Stay (HOSPITAL_COMMUNITY)
Admission: AD | Admit: 2018-05-22 | Discharge: 2018-05-22 | Disposition: A | Discharge status: Home / Self Care | Payer: BLUE CROSS/BLUE SHIELD | Source: Ambulatory Visit | Attending: Obstetrics and Gynecology | Admitting: Obstetrics and Gynecology

## 2018-05-22 DIAGNOSIS — R030 Elevated blood-pressure reading, without diagnosis of hypertension: Secondary | ICD-10-CM | POA: Diagnosis not present

## 2018-05-22 DIAGNOSIS — N898 Other specified noninflammatory disorders of vagina: Secondary | ICD-10-CM | POA: Insufficient documentation

## 2018-05-22 DIAGNOSIS — O26893 Other specified pregnancy related conditions, third trimester: Secondary | ICD-10-CM | POA: Diagnosis not present

## 2018-05-22 DIAGNOSIS — Z3A37 37 weeks gestation of pregnancy: Secondary | ICD-10-CM | POA: Insufficient documentation

## 2018-05-22 DIAGNOSIS — O471 False labor at or after 37 completed weeks of gestation: Secondary | ICD-10-CM | POA: Insufficient documentation

## 2018-05-22 LAB — COMPREHENSIVE METABOLIC PANEL
ALT: 12 U/L (ref 0–44)
AST: 20 U/L (ref 15–41)
Albumin: 2.4 g/dL — ABNORMAL LOW (ref 3.5–5.0)
Alkaline Phosphatase: 148 U/L — ABNORMAL HIGH (ref 38–126)
Anion gap: 11 (ref 5–15)
BUN: 12 mg/dL (ref 6–20)
CO2: 21 mmol/L — ABNORMAL LOW (ref 22–32)
Calcium: 8.6 mg/dL — ABNORMAL LOW (ref 8.9–10.3)
Chloride: 103 mmol/L (ref 98–111)
Creatinine, Ser: 0.68 mg/dL (ref 0.44–1.00)
GFR calc Af Amer: >60 mL/min (ref >60)
GFR calc non Af Amer: >60 mL/min (ref >60)
Glucose, Bld: 89 mg/dL (ref 70–99)
Potassium: 3.9 mmol/L (ref 3.5–5.1)
Sodium: 135 mmol/L (ref 135–145)
Total Bilirubin: 0.4 mg/dL (ref 0.3–1.2)
Total Protein: 6 g/dL — ABNORMAL LOW (ref 6.5–8.1)

## 2018-05-22 LAB — CBC
HCT: 33.1 % — ABNORMAL LOW (ref 36.0–46.0)
Hemoglobin: 11 g/dL — ABNORMAL LOW (ref 12.0–15.0)
MCH: 30.1 pg (ref 26.0–34.0)
MCHC: 33.2 g/dL (ref 30.0–36.0)
MCV: 90.4 fL (ref 80.0–100.0)
Platelets: 173 10*3/uL (ref 150–400)
RBC: 3.66 MIL/uL — ABNORMAL LOW (ref 3.87–5.11)
RDW: 12.8 % (ref 11.5–15.5)
WBC: 12.6 10*3/uL — ABNORMAL HIGH (ref 4.0–10.5)
nRBC: 0 % (ref 0.0–0.2)

## 2018-05-22 LAB — PROTEIN / CREATININE RATIO, URINE
Creatinine, Urine: 108.9 mg/dL
Protein Creatinine Ratio: 0.16 mg/mg{Cre} — ABNORMAL HIGH (ref 0.00–0.15)
Total Protein, Urine: 17 mg/dL

## 2018-05-22 LAB — POCT FERN TEST: POCT Fern Test: NEGATIVE

## 2018-05-22 NOTE — Discharge Instructions (Signed)
Hypertension During Pregnancy  Hypertension is also called high blood pressure. High blood pressure means that the force of your blood moving in your body is too strong. When you are pregnant, this condition should be watched carefully. It can cause problems for you and your baby. Follow these instructions at home: Eating and drinking   Drink enough fluid to keep your pee (urine) pale yellow.  Avoid caffeine. Lifestyle  Do not use any products that contain nicotine or tobacco, such as cigarettes and e-cigarettes. If you need help quitting, ask your doctor.  Do not use alcohol or drugs.  Avoid stress.  Rest and get plenty of sleep. General instructions  Take over-the-counter and prescription medicines only as told by your doctor.  While lying down, lie on your left side. This keeps pressure off your major blood vessels.  While sitting or lying down, raise (elevate) your feet. Try putting some pillows under your lower legs.  Exercise regularly. Ask your doctor what kinds of exercise are best for you.  Keep all prenatal and follow-up visits as told by your doctor. This is important. Contact a doctor if:  You have symptoms that your doctor told you to watch for, such as: ? Throwing up (vomiting). ? Feeling sick to your stomach (nausea). ? Headache. Get help right away if you have:  Very bad belly pain that does not get better with treatment.  A very bad headache that does not get better.  Throwing up that does not get better with treatment.  Sudden, fast weight gain.  Sudden swelling in your hands, ankles, or face.  Bleeding from your vagina.  Blood in your pee.  Fewer movements from your baby than usual.  Blurry vision.  Double vision.  Muscle twitching.  Sudden muscle tightening (spasms).  Trouble breathing.  Blue fingernails or lips. Summary  Hypertension is also called high blood pressure. High blood pressure means that the force of your blood moving  in your body is too strong.  When you are pregnant, this condition should be watched carefully. It can cause problems for you and your baby.  Get help right away if you have symptoms that your doctor told you to watch for. This information is not intended to replace advice given to you by your health care provider. Make sure you discuss any questions you have with your health care provider. Document Released: 02/24/2010 Document Revised: 01/08/2017 Document Reviewed: 10/04/2015 Elsevier Interactive Patient Education  2019 Elsevier Inc. Vaginal Delivery  Vaginal delivery means that you give birth by pushing your baby out of your birth canal (vagina). A team of health care providers will help you before, during, and after vaginal delivery. Birth experiences are unique for every woman and every pregnancy, and birth experiences vary depending on where you choose to give birth. What happens when I arrive at the birth center or hospital? Once you are in labor and have been admitted into the hospital or birth center, your health care provider may:  Review your pregnancy history and any concerns that you have.  Insert an IV into one of your veins. This may be used to give you fluids and medicines.  Check your blood pressure, pulse, temperature, and heart rate (vital signs).  Check whether your bag of water (amniotic sac) has broken (ruptured).  Talk with you about your birth plan and discuss pain control options. Monitoring Your health care provider may monitor your contractions (uterine monitoring) and your baby's heart rate (fetal monitoring). You may need  to be monitored:  Often, but not continuously (intermittently).  All the time or for long periods at a time (continuously). Continuous monitoring may be needed if: ? You are taking certain medicines, such as medicine to relieve pain or make your contractions stronger. ? You have pregnancy or labor complications. Monitoring may be done  by:  Placing a special stethoscope or a handheld monitoring device on your abdomen to check your baby's heartbeat and to check for contractions.  Placing monitors on your abdomen (external monitors) to record your baby's heartbeat and the frequency and length of contractions.  Placing monitors inside your uterus through your vagina (internal monitors) to record your baby's heartbeat and the frequency, length, and strength of your contractions. Depending on the type of monitor, it may remain in your uterus or on your baby's head until birth.  Telemetry. This is a type of continuous monitoring that can be done with external or internal monitors. Instead of having to stay in bed, you are able to move around during telemetry. Physical exam Your health care provider may perform frequent physical exams. This may include:  Checking how and where your baby is positioned in your uterus.  Checking your cervix to determine: ? Whether it is thinning out (effacing). ? Whether it is opening up (dilating). What happens during labor and delivery?  Normal labor and delivery is divided into the following three stages: Stage 1  This is the longest stage of labor.  This stage can last for hours or days.  Throughout this stage, you will feel contractions. Contractions generally feel mild, infrequent, and irregular at first. They get stronger, more frequent (about every 2-3 minutes), and more regular as you move through this stage.  This stage ends when your cervix is completely dilated to 4 inches (10 cm) and completely effaced. Stage 2  This stage starts once your cervix is completely effaced and dilated and lasts until the delivery of your baby.  This stage may last from 20 minutes to 2 hours.  This is the stage where you will feel an urge to push your baby out of your vagina.  You may feel stretching and burning pain, especially when the widest part of your baby's head passes through the vaginal  opening (crowning).  Once your baby is delivered, the umbilical cord will be clamped and cut. This usually occurs after waiting a period of 1-2 minutes after delivery.  Your baby will be placed on your bare chest (skin-to-skin contact) in an upright position and covered with a warm blanket. Watch your baby for feeding cues, like rooting or sucking, and help the baby to your breast for his or her first feeding. Stage 3  This stage starts immediately after the birth of your baby and ends after you deliver the placenta.  This stage may take anywhere from 5 to 30 minutes.  After your baby has been delivered, you will feel contractions as your body expels the placenta and your uterus contracts to control bleeding. What can I expect after labor and delivery?  After labor is over, you and your baby will be monitored closely until you are ready to go home to ensure that you are both healthy. Your health care team will teach you how to care for yourself and your baby.  You and your baby will stay in the same room (rooming in) during your hospital stay. This will encourage early bonding and successful breastfeeding.  You may continue to receive fluids and medicines through  an IV.  Your uterus will be checked and massaged regularly (fundal massage).  You will have some soreness and pain in your abdomen, vagina, and the area of skin between your vaginal opening and your anus (perineum).  If an incision was made near your vagina (episiotomy) or if you had some vaginal tearing during delivery, cold compresses may be placed on your episiotomy or your tear. This helps to reduce pain and swelling.  You may be given a squirt bottle to use instead of wiping when you go to the bathroom. To use the squirt bottle, follow these steps: ? Before you urinate, fill the squirt bottle with warm water. Do not use hot water. ? After you urinate, while you are sitting on the toilet, use the squirt bottle to rinse the  area around your urethra and vaginal opening. This rinses away any urine and blood. ? Fill the squirt bottle with clean water every time you use the bathroom.  It is normal to have vaginal bleeding after delivery. Wear a sanitary pad for vaginal bleeding and discharge. Summary  Vaginal delivery means that you will give birth by pushing your baby out of your birth canal (vagina).  Your health care provider may monitor your contractions (uterine monitoring) and your baby's heart rate (fetal monitoring).  Your health care provider may perform a physical exam.  Normal labor and delivery is divided into three stages.  After labor is over, you and your baby will be monitored closely until you are ready to go home. This information is not intended to replace advice given to you by your health care provider. Make sure you discuss any questions you have with your health care provider. Document Released: 11/01/2007 Document Revised: 02/26/2017 Document Reviewed: 02/26/2017 Elsevier Interactive Patient Education  2019 [employer]

## 2018-05-22 NOTE — MAU Provider Note (Signed)
History   [id number]   Chief Complaint  Patient presents with  . Rupture of Membranes    HPI Katherine MooresCasey Miles is a 33 y.o. female  G1P0000 here with report of watery vaginal discharge that began at approximately leaking fluid several times which soaked her clothing.  Has some mild contractions.  . Pt denies vaginal bleeding.   +fetal movement.   All other systems negative.    RN Note; Pt reports that she started leaking clear fluid around 0400 and it has continued to leak out. Pt reports some mild cramping. Reports good fetal movement. Cervix closed on last exam  Patient's last menstrual period was 09/01/2017 (exact date).  OB History  Gravida Para Term Preterm AB Living  1 0 0 0 0 0  SAB TAB Ectopic Multiple Live Births  0 0 0 0      # Outcome Date GA Lbr Len/2nd Weight Sex Delivery Anes PTL Lv  1 Current             Past Medical History:  Diagnosis Date  . Vaginal Pap smear, abnormal     Family History  Problem Relation Age of Onset  . Hypertension Father   . Cancer Maternal Uncle        lung    Social History   Socioeconomic History  . Marital status: Married    Spouse name: Thayer OhmChris  . Number of children: 0  . Years of education: Not on file  . Highest education level: Some college, no degree  Occupational History  . Not on file  Social Needs  . Financial resource strain: Not very hard  . Food insecurity:    Worry: Never true    Inability: Never true  . Transportation needs:    Medical: No    Non-medical: No  Tobacco Use  . Smoking status: Never Smoker  . Smokeless tobacco: Never Used  Substance and Sexual Activity  . Alcohol use: No    Comment: wine occ  . Drug use: No  . Sexual activity: Yes    Birth control/protection: None  Lifestyle  . Physical activity:    Days per week: 2 days    Minutes per session: 20 min  . Stress: To some extent  Relationships  . Social connections:    Talks on phone: Twice a week    Gets together: Once a week     Attends religious service: More than 4 times per year    Active member of club or organization: Yes    Attends meetings of clubs or organizations: More than 4 times per year    Relationship status: Married  Other Topics Concern  . Not on file  Social History Narrative  . Not on file    No Known Allergies  No current facility-administered medications on file prior to encounter.    Current Outpatient Medications on File Prior to Encounter  Medication Sig Dispense Refill  . Prenatal Multivit-Min-Fe-FA (PRENATAL VITAMINS) 0.8 MG tablet Take 1 tablet by mouth daily. 30 tablet 12  . promethazine (PHENERGAN) 25 MG tablet Take 0.5-1 tablets (12.5-25 mg total) by mouth every 6 (six) hours as needed for nausea or vomiting. 30 tablet 0     Review of Systems  Constitutional: Negative for chills and fever.  Eyes: Negative for visual disturbance.  Respiratory: Negative for shortness of breath.   Gastrointestinal: Positive for abdominal pain. Negative for constipation, diarrhea and nausea.  Genitourinary: Negative for vaginal bleeding.  Neurological: Negative for dizziness and  weakness.       No headache No swelling     Physical Exam   Vitals:   05/22/18 0535 05/22/18 0545  BP: (!) 143/85 127/80  Pulse: 79 85  Resp: 20   Temp: 97.9 F (36.6 C)   TempSrc: Oral   SpO2: 100% 98%   Vitals:   05/22/18 0600 05/22/18 0620 05/22/18 0630 05/22/18 0655  BP: 125/90 (!) 144/82 132/74 128/77  Pulse: 80 68 72 68  Resp:      Temp:      TempSrc:      SpO2: 97%       Physical Exam  Constitutional: She is oriented to person, place, and time. She appears well-developed and well-nourished. No distress.  HENT:  Head: Normocephalic.  Cardiovascular: Normal rate.  Respiratory: Effort normal. No respiratory distress.  GI: Soft. There is no abdominal tenderness. There is no rebound and no guarding.  Genitourinary:    Genitourinary Comments: Sterile speculum exam, neg pooling, neg  ferning Dilation: Closed Effacement (%): 60 Station: -3 Exam by:: Wynelle Bourgeois CNM    Musculoskeletal: Normal range of motion.        General: No edema.  Neurological: She is alert and oriented to person, place, and time. She displays normal reflexes. She exhibits normal muscle tone.  Skin: Skin is warm and dry.  Psychiatric: She has a normal mood and affect.    MAU Course  Procedures Results for orders placed or performed during the hospital encounter of 05/22/18 (from the past 24 hour(s))  Protein / creatinine ratio, urine     Status: Abnormal   Collection Time: 05/22/18  6:20 AM  Result Value Ref Range   Creatinine, Urine 108.90 mg/dL   Total Protein, Urine 17 mg/dL   Protein Creatinine Ratio 0.16 (H) 0.00 - 0.15 mg/mg[Cre]  CBC     Status: Abnormal   Collection Time: 05/22/18  6:25 AM  Result Value Ref Range   WBC 12.6 (H) 4.0 - 10.5 K/uL   RBC 3.66 (L) 3.87 - 5.11 MIL/uL   Hemoglobin 11.0 (L) 12.0 - 15.0 g/dL   HCT 16.1 (L) 09.6 - 04.5 %   MCV 90.4 80.0 - 100.0 fL   MCH 30.1 26.0 - 34.0 pg   MCHC 33.2 30.0 - 36.0 g/dL   RDW 40.9 81.1 - 91.4 %   Platelets 173 150 - 400 K/uL   nRBC 0.0 0.0 - 0.2 %  Comprehensive metabolic panel     Status: Abnormal   Collection Time: 05/22/18  6:25 AM  Result Value Ref Range   Sodium 135 135 - 145 mmol/L   Potassium 3.9 3.5 - 5.1 mmol/L   Chloride 103 98 - 111 mmol/L   CO2 21 (L) 22 - 32 mmol/L   Glucose, Bld 89 70 - 99 mg/dL   BUN 12 6 - 20 mg/dL   Creatinine, Ser 7.82 0.44 - 1.00 mg/dL   Calcium 8.6 (L) 8.9 - 10.3 mg/dL   Total Protein 6.0 (L) 6.5 - 8.1 g/dL   Albumin 2.4 (L) 3.5 - 5.0 g/dL   AST 20 15 - 41 U/L   ALT 12 0 - 44 U/L   Alkaline Phosphatase 148 (H) 38 - 126 U/L   Total Bilirubin 0.4 0.3 - 1.2 mg/dL   GFR calc non Af Amer >60 >60 mL/min   GFR calc Af Amer >60 >60 mL/min   Anion gap 11 5 - 15  Fern Test     Status: None  Collection Time: 05/22/18  6:38 AM  Result Value Ref Range   POCT Fern Test Negative =  intact amniotic membranes     MDM No evidence found for ROM She had two borderline elevated blood pressures, so labs were ordered and resulted as normal. EFM showed reactive FHR pattern  Assessment and Plan   SIUP at @[redacted]w[redacted]d @ Vaginal discharge, no evidence of ROM Not in labor but more effaced Transient elevations in blood pressure with normal labs  Discharge home Labor precautions Preeclampsia precautions  Follow up in office or in MAU PRN Encouraged to return here or to other Urgent Care/ED if she develops worsening of symptoms, increase in pain, fever, or other concerning symptoms.     Aviva Signs, CNM 05/22/2018 5:58 AM

## 2018-05-22 NOTE — MAU Note (Signed)
Pt reports that she started leaking clear fluid around 0400 and it has continued to leak out. Pt reports some mild cramping. Reports good fetal movement. Cervix closed on last exam

## 2018-06-04 ENCOUNTER — Inpatient Hospital Stay (HOSPITAL_COMMUNITY)
Admission: AD | Admit: 2018-06-04 | Discharge: 2018-06-06 | DRG: 806 | Disposition: A | Discharge status: Home / Self Care | Payer: BLUE CROSS/BLUE SHIELD | Attending: Obstetrics and Gynecology | Admitting: Obstetrics and Gynecology

## 2018-06-04 ENCOUNTER — Encounter (HOSPITAL_COMMUNITY): Payer: Self-pay | Admitting: *Deleted

## 2018-06-04 ENCOUNTER — Inpatient Hospital Stay (HOSPITAL_COMMUNITY): Payer: BLUE CROSS/BLUE SHIELD | Admitting: Anesthesiology

## 2018-06-04 ENCOUNTER — Other Ambulatory Visit: Payer: Self-pay

## 2018-06-04 DIAGNOSIS — D62 Acute posthemorrhagic anemia: Secondary | ICD-10-CM | POA: Diagnosis not present

## 2018-06-04 DIAGNOSIS — Z3A39 39 weeks gestation of pregnancy: Secondary | ICD-10-CM | POA: Diagnosis not present

## 2018-06-04 DIAGNOSIS — O43123 Velamentous insertion of umbilical cord, third trimester: Secondary | ICD-10-CM | POA: Diagnosis present

## 2018-06-04 DIAGNOSIS — O99824 Streptococcus B carrier state complicating childbirth: Secondary | ICD-10-CM | POA: Diagnosis present

## 2018-06-04 DIAGNOSIS — O9081 Anemia of the puerperium: Secondary | ICD-10-CM | POA: Diagnosis not present

## 2018-06-04 DIAGNOSIS — Z6791 Unspecified blood type, Rh negative: Secondary | ICD-10-CM

## 2018-06-04 DIAGNOSIS — O26893 Other specified pregnancy related conditions, third trimester: Secondary | ICD-10-CM | POA: Diagnosis present

## 2018-06-04 LAB — COMPREHENSIVE METABOLIC PANEL
ALT: 12 U/L (ref 0–44)
AST: 20 U/L (ref 15–41)
Albumin: 2.9 g/dL — ABNORMAL LOW (ref 3.5–5.0)
Alkaline Phosphatase: 188 U/L — ABNORMAL HIGH (ref 38–126)
Anion gap: 11 (ref 5–15)
BUN: 11 mg/dL (ref 6–20)
CO2: 20 mmol/L — ABNORMAL LOW (ref 22–32)
Calcium: 8.7 mg/dL — ABNORMAL LOW (ref 8.9–10.3)
Chloride: 105 mmol/L (ref 98–111)
Creatinine, Ser: 0.84 mg/dL (ref 0.44–1.00)
GFR calc Af Amer: >60 mL/min (ref >60)
GFR calc non Af Amer: >60 mL/min (ref >60)
Glucose, Bld: 88 mg/dL (ref 70–99)
Potassium: 4.1 mmol/L (ref 3.5–5.1)
Sodium: 136 mmol/L (ref 135–145)
Total Bilirubin: 0.5 mg/dL (ref 0.3–1.2)
Total Protein: 7 g/dL (ref 6.5–8.1)

## 2018-06-04 LAB — CBC
HCT: 34.9 % — ABNORMAL LOW (ref 36.0–46.0)
Hemoglobin: 11.9 g/dL — ABNORMAL LOW (ref 12.0–15.0)
MCH: 30.8 pg (ref 26.0–34.0)
MCHC: 34.1 g/dL (ref 30.0–36.0)
MCV: 90.4 fL (ref 80.0–100.0)
Platelets: 205 10*3/uL (ref 150–400)
RBC: 3.86 MIL/uL — ABNORMAL LOW (ref 3.87–5.11)
RDW: 13.2 % (ref 11.5–15.5)
WBC: 17.3 10*3/uL — ABNORMAL HIGH (ref 4.0–10.5)
nRBC: 0 % (ref 0.0–0.2)

## 2018-06-04 LAB — RPR: RPR Ser Ql: NONREACTIVE

## 2018-06-04 MED ORDER — SODIUM CHLORIDE 0.9 % IV SOLN
5.0000 10*6.[IU] | Freq: Once | INTRAVENOUS | Status: AC
  Administered 2018-06-04: 5 10*6.[IU] via INTRAVENOUS
  Filled 2018-06-04: qty 5

## 2018-06-04 MED ORDER — FENTANYL-BUPIVACAINE-NACL 0.5-0.125-0.9 MG/250ML-% EP SOLN
12.0000 mL/h | EPIDURAL | Status: DC | PRN
  Filled 2018-06-04: qty 250

## 2018-06-04 MED ORDER — BENZOCAINE-MENTHOL 20-0.5 % EX AERO
1.0000 "application " | INHALATION_SPRAY | CUTANEOUS | Status: DC | PRN
  Administered 2018-06-04: 1 via TOPICAL
  Filled 2018-06-04: qty 56

## 2018-06-04 MED ORDER — WITCH HAZEL-GLYCERIN EX PADS: 1.0000 "application " | MEDICATED_PAD | CUTANEOUS | Status: DC | PRN

## 2018-06-04 MED ORDER — OXYCODONE-ACETAMINOPHEN 5-325 MG PO TABS: 1.0000 | ORAL_TABLET | ORAL | Status: DC | PRN

## 2018-06-04 MED ORDER — SODIUM CHLORIDE (PF) 0.9 % IJ SOLN
INTRAMUSCULAR | Status: DC | PRN
  Administered 2018-06-04: 12 mL/h via EPIDURAL

## 2018-06-04 MED ORDER — TERBUTALINE SULFATE 1 MG/ML IJ SOLN: 0.2500 mg | Freq: Once | INTRAMUSCULAR | Status: DC | PRN

## 2018-06-04 MED ORDER — TETANUS-DIPHTH-ACELL PERTUSSIS 5-2.5-18.5 LF-MCG/0.5 IM SUSP: 0.5000 mL | Freq: Once | INTRAMUSCULAR | Status: DC

## 2018-06-04 MED ORDER — SIMETHICONE 80 MG PO CHEW: 80.0000 mg | CHEWABLE_TABLET | ORAL | Status: DC | PRN

## 2018-06-04 MED ORDER — LACTATED RINGERS IV SOLN
500.0000 mL | Freq: Once | INTRAVENOUS | Status: AC
  Administered 2018-06-04: 03:00:00 500 mL via INTRAVENOUS

## 2018-06-04 MED ORDER — FLEET ENEMA 7-19 GM/118ML RE ENEM: 1.0000 | ENEMA | Freq: Every day | RECTAL | Status: DC | PRN

## 2018-06-04 MED ORDER — PRENATAL MULTIVITAMIN CH
1.0000 | ORAL_TABLET | Freq: Every day | ORAL | Status: DC
  Filled 2018-06-04 (×2): qty 1

## 2018-06-04 MED ORDER — ONDANSETRON HCL 4 MG/2ML IJ SOLN: 4.0000 mg | Freq: Four times a day (QID) | INTRAMUSCULAR | Status: DC | PRN

## 2018-06-04 MED ORDER — PHENYLEPHRINE 40 MCG/ML (10ML) SYRINGE FOR IV PUSH (FOR BLOOD PRESSURE SUPPORT): 80.0000 ug | PREFILLED_SYRINGE | INTRAVENOUS | Status: DC | PRN

## 2018-06-04 MED ORDER — DIPHENHYDRAMINE HCL 50 MG/ML IJ SOLN: 12.5000 mg | INTRAMUSCULAR | Status: DC | PRN

## 2018-06-04 MED ORDER — METHYLERGONOVINE MALEATE 0.2 MG/ML IJ SOLN: 0.2000 mg | INTRAMUSCULAR | Status: DC | PRN

## 2018-06-04 MED ORDER — EPHEDRINE 5 MG/ML INJ: 10.0000 mg | INTRAVENOUS | Status: DC | PRN

## 2018-06-04 MED ORDER — SOD CITRATE-CITRIC ACID 500-334 MG/5ML PO SOLN: 30.0000 mL | ORAL | Status: DC | PRN

## 2018-06-04 MED ORDER — LACTATED RINGERS IV SOLN: 500.0000 mL | INTRAVENOUS | Status: DC | PRN

## 2018-06-04 MED ORDER — SENNOSIDES-DOCUSATE SODIUM 8.6-50 MG PO TABS
2.0000 | ORAL_TABLET | ORAL | Status: DC
  Administered 2018-06-04 – 2018-06-05 (×2): 2 via ORAL
  Filled 2018-06-04 (×2): qty 2

## 2018-06-04 MED ORDER — ONDANSETRON HCL 4 MG PO TABS: 4.0000 mg | ORAL_TABLET | ORAL | Status: DC | PRN

## 2018-06-04 MED ORDER — LACTATED RINGERS IV SOLN
INTRAVENOUS | Status: DC
  Administered 2018-06-04 (×2): via INTRAVENOUS

## 2018-06-04 MED ORDER — DIPHENHYDRAMINE HCL 25 MG PO CAPS: 25.0000 mg | ORAL_CAPSULE | Freq: Four times a day (QID) | ORAL | Status: DC | PRN

## 2018-06-04 MED ORDER — COCONUT OIL OIL: 1.0000 "application " | TOPICAL_OIL | Status: DC | PRN

## 2018-06-04 MED ORDER — ACETAMINOPHEN 325 MG PO TABS: 650.0000 mg | ORAL_TABLET | ORAL | Status: DC | PRN

## 2018-06-04 MED ORDER — METHYLERGONOVINE MALEATE 0.2 MG PO TABS: 0.2000 mg | ORAL_TABLET | ORAL | Status: DC | PRN

## 2018-06-04 MED ORDER — IBUPROFEN 600 MG PO TABS
600.0000 mg | ORAL_TABLET | Freq: Four times a day (QID) | ORAL | Status: DC
  Administered 2018-06-04 – 2018-06-06 (×8): 600 mg via ORAL
  Filled 2018-06-04 (×8): qty 1

## 2018-06-04 MED ORDER — OXYTOCIN 40 UNITS IN NORMAL SALINE INFUSION - SIMPLE MED
2.5000 [IU]/h | INTRAVENOUS | Status: DC
  Filled 2018-06-04: qty 1000

## 2018-06-04 MED ORDER — OXYTOCIN 40 UNITS IN NORMAL SALINE INFUSION - SIMPLE MED
1.0000 m[IU]/min | INTRAVENOUS | Status: DC
  Administered 2018-06-04: 2 m[IU]/min via INTRAVENOUS

## 2018-06-04 MED ORDER — OXYCODONE-ACETAMINOPHEN 5-325 MG PO TABS: 2.0000 | ORAL_TABLET | ORAL | Status: DC | PRN

## 2018-06-04 MED ORDER — OXYTOCIN BOLUS FROM INFUSION
500.0000 mL | Freq: Once | INTRAVENOUS | Status: AC
  Administered 2018-06-04: 500 mL via INTRAVENOUS

## 2018-06-04 MED ORDER — ONDANSETRON HCL 4 MG/2ML IJ SOLN: 4.0000 mg | INTRAMUSCULAR | Status: DC | PRN

## 2018-06-04 MED ORDER — LIDOCAINE HCL (PF) 1 % IJ SOLN: 30.0000 mL | INTRAMUSCULAR | Status: DC | PRN

## 2018-06-04 MED ORDER — LIDOCAINE HCL (PF) 1 % IJ SOLN
INTRAMUSCULAR | Status: DC | PRN
  Administered 2018-06-04: 5 mL via EPIDURAL

## 2018-06-04 MED ORDER — PENICILLIN G 3 MILLION UNITS IVPB - SIMPLE MED
3.0000 10*6.[IU] | INTRAVENOUS | Status: DC
  Administered 2018-06-04: 3 10*6.[IU] via INTRAVENOUS
  Filled 2018-06-04: qty 100

## 2018-06-04 MED ORDER — DIBUCAINE (PERIANAL) 1 % EX OINT: 1.0000 "application " | TOPICAL_OINTMENT | CUTANEOUS | Status: DC | PRN

## 2018-06-04 MED ORDER — ZOLPIDEM TARTRATE 5 MG PO TABS: 5.0000 mg | ORAL_TABLET | Freq: Every evening | ORAL | Status: DC | PRN

## 2018-06-04 NOTE — MAU Note (Signed)
PT SAYS SHE SAW VAG BLEEDING  AT .  [facility]  WITH  DR TAAVON-  VE- CLOSED.  DENIES HSV AN MRSA.  GBS-  POSITIVE.  UC'S STARTED  AT 8 PM.

## 2018-06-04 NOTE — H&P (Addendum)
Katherine Miles is a 33 y.o. female presenting for labor. OB History    Gravida  1   Para  0   Term  0   Preterm  0   AB  0   Living  0     SAB  0   TAB  0   Ectopic  0   Multiple  0   Live Births             Past Medical History:  Diagnosis Date  . Vaginal Pap smear, abnormal    History reviewed. No pertinent surgical history. Family History: family history includes Cancer in her maternal uncle; Hypertension in her father. Social History:  reports that she has never smoked. She has never used smokeless tobacco. She reports that she does not drink alcohol or use drugs.     Maternal Diabetes: No Genetic Screening: Normal Maternal Ultrasounds/Referrals: Normal Fetal Ultrasounds or other Referrals:  None- marginal cord insertion Maternal Substance Abuse:  No Significant Maternal Medications:  None Significant Maternal Lab Results:  None- gbs pos Other Comments:  None  Review of Systems  Constitutional: Negative.   All other systems reviewed and are negative.  Maternal Medical History:  Reason for admission: Contractions.   Contractions: Onset was 3-5 hours ago.   Frequency: regular.    Fetal activity: Perceived fetal activity is normal.   Last perceived fetal movement was within the past hour.    Prenatal complications: no prenatal complications No oligohydramnios.   Prenatal Complications - Diabetes: none.    Dilation: 5 Effacement (%): 80 Station: -1 Exam by:: C SUllivan RN Blood pressure 121/70, pulse 81, temperature 99 F (37.2 C), temperature source Oral, resp. rate 14, height 5\' 5"  (1.651 m), weight 74.8 kg, last menstrual period 09/01/2017, SpO2 99 %. Maternal Exam:  Uterine Assessment: Contraction strength is moderate.  Contraction frequency is irregular.   Abdomen: Patient reports no abdominal tenderness. Fetal presentation: vertex  Introitus: Normal vulva. Normal vagina.  Ferning test: not done.  Nitrazine test: not done. Amniotic  fluid character: not assessed.  Pelvis: adequate for delivery.   Cervix: Cervix evaluated by digital exam.     Physical Exam  Nursing note and vitals reviewed. Constitutional: She is oriented to person, place, and time. She appears well-developed and well-nourished.  HENT:  Head: Normocephalic and atraumatic.  Neck: Normal range of motion. Neck supple.  Cardiovascular: Normal rate and regular rhythm.  Respiratory: Effort normal and breath sounds normal.  GI: Soft. Bowel sounds are normal.  Genitourinary:    Vulva, vagina and uterus normal.   Musculoskeletal: Normal range of motion.  Neurological: She is alert and oriented to person, place, and time. She has normal reflexes.  Skin: Skin is warm and dry.  Psychiatric: She has a normal mood and affect.    Prenatal labs: ABO, Rh: --/--/O NEG (04/29 0220) Antibody: POS (04/29 0220) Rubella: 8.14 (10/14 1652) RPR: Non Reactive (10/14 1652)  HBsAg: Negative (10/14 1652)  HIV: Non Reactive (10/14 1652)  GBS:     Assessment/Plan: Term IUP Active labor Admit   Brison Fiumara J 06/04/2018, 6:57 AM

## 2018-06-04 NOTE — Anesthesia Preprocedure Evaluation (Addendum)
Anesthesia Evaluation  Patient identified by MRN, date of birth, ID band Patient awake    Reviewed: Allergy & Precautions, NPO status , Patient's Chart, lab work & pertinent test results  Airway Mallampati: II  TM Distance: >3 FB Neck ROM: Full    Dental no notable dental hx. (+) Teeth Intact   Pulmonary neg pulmonary ROS,    Pulmonary exam normal breath sounds clear to auscultation       Cardiovascular Exercise Tolerance: Good hypertension, Normal cardiovascular exam Rhythm:Regular Rate:Normal  PIH   Neuro/Psych negative neurological ROS  negative psych ROS   GI/Hepatic negative GI ROS, Neg liver ROS,   Endo/Other  negative endocrine ROS  Renal/GU negative Renal ROS     Musculoskeletal   Abdominal   Peds  Hematology Hgb 11.9 Plt 205   Anesthesia Other Findings   Reproductive/Obstetrics (+) Pregnancy                            Anesthesia Physical Anesthesia Plan  ASA: III  Anesthesia Plan: Epidural   Post-op Pain Management:    Induction:   PONV Risk Score and Plan:   Airway Management Planned:   Additional Equipment:   Intra-op Plan:   Post-operative Plan:   Informed Consent: I have reviewed the patients History and Physical, chart, labs and discussed the procedure including the risks, benefits and alternatives for the proposed anesthesia with the patient or authorized representative who has indicated his/her understanding and acceptance.       Plan Discussed with:   Anesthesia Plan Comments: (G1P0 w PIH for LEA)       Anesthesia Quick Evaluation

## 2018-06-04 NOTE — Anesthesia Postprocedure Evaluation (Signed)
Anesthesia Post Note  Patient: Sapna Florence  Procedure(s) Performed: AN AD HOC LABOR EPIDURAL     Patient location during evaluation: Mother Baby Anesthesia Type: Epidural Level of consciousness: awake and alert Pain management: pain level controlled Vital Signs Assessment: post-procedure vital signs reviewed and stable Respiratory status: spontaneous breathing, nonlabored ventilation and respiratory function stable Cardiovascular status: stable Postop Assessment: no headache, no backache, epidural receding, no apparent nausea or vomiting, patient able to bend at knees, able to ambulate and adequate PO intake Anesthetic complications: no    Last Vitals:  Vitals:   06/04/18 1300 06/04/18 1433  BP: 124/77 130/69  Pulse: 79 81  Resp: 18 16  Temp: 37.2 C 36.7 C  SpO2: 99% 99%    Last Pain:  Vitals:   06/04/18 1433  TempSrc: Oral  PainSc: 5    Pain Goal: Patients Stated Pain Goal: 3 (06/04/18 0355)                 Laban Emperor

## 2018-06-04 NOTE — Anesthesia Procedure Notes (Signed)
Epidural Patient location during procedure: OB Start time: 06/04/2018 3:24 AM End time: 06/04/2018 3:40 AM  Staffing Anesthesiologist: Trevor Iha, MD Performed: anesthesiologist   Preanesthetic Checklist Completed: patient identified, site marked, surgical consent, pre-op evaluation, timeout performed, IV checked, risks and benefits discussed and monitors and equipment checked  Epidural Patient position: sitting Prep: site prepped and draped and DuraPrep Patient monitoring: continuous pulse ox and blood pressure Approach: midline Location: L3-L4 Injection technique: LOR air  Needle:  Needle type: Tuohy  Needle gauge: 17 G Needle length: 9 cm and 9 Needle insertion depth: 5 cm cm Catheter type: closed end flexible Catheter size: 19 Gauge Catheter at skin depth: 10 cm Test dose: negative  Assessment Events: blood not aspirated, injection not painful, no injection resistance, negative IV test and no paresthesia  Additional Notes Patient identified. Risks/Benefits/Options discussed with patient including but not limited to bleeding, infection, nerve damage, paralysis, failed block, incomplete pain control, headache, blood pressure changes, nausea, vomiting, reactions to medication both or allergic, itching and postpartum back pain. Confirmed with bedside nurse the patient's most recent platelet count. Confirmed with patient that they are not currently taking any anticoagulation, have any bleeding history or any family history of bleeding disorders. Patient expressed understanding and wished to proceed. All questions were answered. Sterile technique was used throughout the entire procedure. Please see nursing notes for vital signs. Test dose was given through epidural needle and negative prior to continuing to dose epidural or start infusion. Warning signs of high block given to the patient including shortness of breath, tingling/numbness in hands, complete motor block, or any  concerning symptoms with instructions to call for help. Patient was given instructions on fall risk and not to get out of bed. All questions and concerns addressed with instructions to call with any issues. 1 Attempt (S) . Patient tolerated procedure well.

## 2018-06-04 NOTE — Lactation Note (Signed)
This note was copied from a baby's chart. Lactation Consultation Note  Patient Name: Girl Beila Jenerette UUEKC'M Date: 06/04/2018 Reason for consult: Initial assessment;1st time breastfeeding;Primapara  1851 - 1908 - I visited Ms. Kiger to assist her with breast feeding. Mom's feeding plan is to offer breast milk and formula. She prefers to use her breast pump and bottle feed over breast feeding. She is formula feeding by bottle at this time while pumping. She is getting droplets when she pumps, and I discussed normal volume expectations for days 1-3.  Ms. Vaughns last pumped around 1800. I recommended that she pump 8 times a day at minimum and recommended that she space pump intervals to no longer than every three to four hours for 15 minutes. We discussed how pumping affects milk production on a supply and demand nature.  Mom and dad could verbalize supplementation volumes for day 1. I recommended that she feed baby 8-12 times a day on demand and that she include in her supplementation her breast milk. I recommended feeding anything she expressed with her pump to baby and discussed the benefits of her colostrum. We discussed when to expect her milk volume to increase.  I recommended that mom and dad both do lots of skin to skin, and we discussed nighttime cluster feeding and day 1 infant feeding patterns.   Mom has her Medela personal pump with her in the room. She states that she will call PRN tomorrow for assistance with setting up her personal pump prior to discharge and if she has any additional questions.   Maternal Data Formula Feeding for Exclusion: No Does the patient have breastfeeding experience prior to this delivery?: No  Feeding Feeding Type: Bottle Fed - Formula Nipple Type: Slow - flow           Interventions    Lactation Tools Discussed/Used WIC Program: No Pump Review: Setup, frequency, and cleaning   Consult Status Consult Status: PRN    Walker Shadow 06/04/2018, 7:29 PM

## 2018-06-05 LAB — CBC
HCT: 25.9 % — ABNORMAL LOW (ref 36.0–46.0)
Hemoglobin: 8.7 g/dL — ABNORMAL LOW (ref 12.0–15.0)
MCH: 31 pg (ref 26.0–34.0)
MCHC: 33.6 g/dL (ref 30.0–36.0)
MCV: 92.2 fL (ref 80.0–100.0)
Platelets: 136 10*3/uL — ABNORMAL LOW (ref 150–400)
RBC: 2.81 MIL/uL — ABNORMAL LOW (ref 3.87–5.11)
RDW: 13.3 % (ref 11.5–15.5)
WBC: 15.5 10*3/uL — ABNORMAL HIGH (ref 4.0–10.5)
nRBC: 0 % (ref 0.0–0.2)

## 2018-06-05 NOTE — Lactation Note (Signed)
This note was copied from a baby's chart. Lactation Consultation Note  Patient Name: Katherine Miles OKHTX'H Date: 06/05/2018 Reason for consult: Follow-up assessment;Mother's request;1st time breastfeeding;Term  1328 - 1405 - Ms. Sardina requested follow up. She states that she has been pumping for 15 minutes every three hours. Dad has been bottle feeding baby while mom pumps. She states that she is seeing only drops of milk at this stage. I reviewed expectations for volume increases.  Mom brought her personal Medela pump in style with her and asked for a demonstration. I showed her how to set it up and we discussed flange size and settings. I showed mom milk storage guidelines from her booklet and how to warm milk.  Mom also brought two size 24 nipple shields with her. She wants to give her baby breast milk; she is tentative about breast feeding however. She states that she feels better today and wants to give it a try WITH use of a nipple shield.   Mom's nipples are in tact and everted. She pointed out some dark brown areas on the tips of her nipples and some dry flaky skin. She states that these changes occurred during pregnancy.  I showed mom how to fit the nipple shield, and we attempted to place baby in football hold on her left breast. Baby had about 15 mls of formula prior to entry and was too sleepy to latch. She did open her mouth around the shield. Mom and I practiced and discussed latch technique and positioning. Baby practiced "lick and learn."   Mom's nipple tissue may not require use of a nipple shield, but mom's comfort level at this stage is to try with one on. I offered encouragement and praise for her efforts and asked that she call for assistance as needed. In the meantime, mom will continue to pump every three hours at a minimum.  Maternal Data Formula Feeding for Exclusion: No Has patient been taught Hand Expression?: Yes Does the patient have breastfeeding experience prior  to this delivery?: No  Feeding Feeding Type: Breast Fed  LATCH Score Latch: Too sleepy or reluctant, no latch achieved, no sucking elicited.  Audible Swallowing: None  Type of Nipple: Everted at rest and after stimulation  Comfort (Breast/Nipple): Soft / non-tender  Hold (Positioning): Assistance needed to correctly position infant at breast and maintain latch.  LATCH Score: 5  Interventions Interventions: Breast feeding basics reviewed;Assisted with latch;Skin to skin;DEBP;Adjust position  Lactation Tools Discussed/Used Tools: Nipple Dorris Carnes;Other (comment)(mom brought her own NS and requested to use them) Pump Review: Setup, frequency, and cleaning;Milk Storage Initiated by:: hl Date initiated:: 06/05/18   Consult Status Consult Status: Follow-up Date: 06/06/18 Follow-up type: In-patient    Walker Shadow 06/05/2018, 2:27 PM

## 2018-06-05 NOTE — Progress Notes (Signed)
PPD #1 S/P SVD  S; no complaint O: BP (!) 127/92   Pulse 67   Temp 98 F (36.7 C) (Oral)   Resp 16   Ht 5\' 5"  (1.651 m)   Wt 74.8 kg   LMP 09/01/2017 (Exact Date)   SpO2 99%   BMI 27.46 kg/m   Breast soft nontender. Nipples with scab Abd; uterus firm at umb nontender Pad. Mod lochia  perineum well approximated  extr  Tr edema  CBC    Component Value Date/Time   WBC 15.5 (H) 06/05/2018 0520   RBC 2.81 (L) 06/05/2018 0520   HGB 8.7 (L) 06/05/2018 0520   HGB 13.1 11/18/2017 1652   HCT 25.9 (L) 06/05/2018 0520   HCT 37.0 11/18/2017 1652   PLT 136 (L) 06/05/2018 0520   PLT 282 11/18/2017 1652   MCV 92.2 06/05/2018 0520   MCV 87 11/18/2017 1652   MCH 31.0 06/05/2018 0520   MCHC 33.6 06/05/2018 0520   RDW 13.3 06/05/2018 0520   RDW 12.5 11/18/2017 1652   LYMPHSABS 2.2 11/18/2017 1652   EOSABS 0.1 11/18/2017 1652   BASOSABS 0.0 11/18/2017 1652   IMP: PPD #1  Doing well  RH neg . Baby RH neg Acute blood loss anemia P) routine pp care. On iron. Anticipate d/c home tomorrow

## 2018-06-06 ENCOUNTER — Encounter (HOSPITAL_COMMUNITY): Payer: Self-pay | Admitting: *Deleted

## 2018-06-06 MED ORDER — FERROUS SULFATE 325 (65 FE) MG PO TABS: 325.0000 mg | ORAL_TABLET | Freq: Every day | ORAL | 3 refills | Status: DC

## 2018-06-06 MED ORDER — IBUPROFEN 600 MG PO TABS: 600.0000 mg | ORAL_TABLET | Freq: Four times a day (QID) | ORAL | 0 refills | Status: DC

## 2018-06-06 NOTE — Progress Notes (Signed)
PPD #2, SVD, 2nd degree repair, baby girl "Katherine Miles"  S:  Reports feeling good; ready to be discharged home              Tolerating po/ No nausea or vomiting / Denies dizziness or SOB             Bleeding is moderate             Pain controlled with Motrin             Up ad lib / ambulatory / voiding QS  Newborn breast feeding with formula supplementing  O:               VS: BP 116/60 (BP Location: Right Arm)   Pulse 71   Temp 98.3 F (36.8 C) (Oral)   Resp 16   Ht 5\' 5"  (1.651 m)   Wt 74.8 kg   LMP 09/01/2017 (Exact Date)   SpO2 98%   BMI 27.46 kg/m  (36.8 C)  71  -  16  116/60  Left side lying/tilt  98 %  -  - ED     06/05/18 2145  98.4 F (36.9 C)  72  -  18  112/53Abnormal   Left side lying/tilt  -  -  - AT   06/05/18 1550  98.3 F (36.8 C)  70  -  16  117/62  Sitting  -  -  - KM   06/05/18 1108  -  73  -  18  124/84  Sitting  -  -  - KI   06/05/18 0300  -  67  -  16  127/92Abnormal                LABS:              Recent Labs    06/04/18 0220 06/05/18 0520  WBC 17.3* 15.5*  HGB 11.9* 8.7*  PLT 205 136*               Blood type: --/--/O NEG (04/29 0220)  Rubella: 8.14 (10/14 1652)                     I&O: Intake/Output      04/30 0701 - 05/01 0700 05/01 0701 - 05/02 0700   I.V. (mL/kg)     Other     Total Intake(mL/kg)     Urine (mL/kg/hr)     Blood     Total Output     Net                        Physical Exam:             Alert and oriented X3  Lungs: Clear and unlabored  Heart: regular rate and rhythm / (+) murmur noted  Abdomen: soft, non-tender, non-distended              Fundus: firm, non-tender, U-E  Perineum: well approximated 2nd degree, minimal edema, no ecchymosis, no erythema  Lochia: scant on pad, recently changed  Extremities: trace LE edema, no calf pain or tenderness    A: PPD # 2, SVD  RH Negative, baby RH Negative - no Rhogam indicated   Transient hypertension in pregnancy, no dx of hypertension   ABL Anemia   2nd degree repair    Doing well - stable status  P: Routine post partum orders  Abdominal binder for discharge   D/c  Home today  [facility] discharge book and instructions and warning s/s reviewed  Begin oral FE supplement daily   F/u with Dr. Billy Coast in 6 weeks   Carlean Jews, MSN, CNM [facility]

## 2018-06-06 NOTE — Discharge Summary (Signed)
Obstetric Discharge Summary   Patient Name: Katherine Miles DOB: [birth year] MRN: [id number]  Date of Admission: 06/04/2018 Date of Discharge: 06/06/2018 Date of Delivery: 06/04/2018 Gestational Age at Delivery: [redacted]w[redacted]d  Primary OB: [facility] - Dr. Billy Coast  Antepartum complications:  - Marginal cord insertion - GBS positive - RH Negative   Prenatal Labs:  ABO, Rh: --/--/O NEG (04/29 0220) Antibody: POS (04/29 0220) Rubella: 8.14 (10/14 1652) RPR: Non Reactive (10/14 1652)  HBsAg: Negative (10/14 1652)  HIV: Non Reactive (10/14 1652)  GBS: Positive   Admitting Diagnosis: Active labor at term   Secondary Diagnoses: Patient Active Problem List   Diagnosis Date Noted  . Normal labor 06/04/2018  . SVD (spontaneous vaginal delivery) 06/04/2018  . Postpartum care following vaginal delivery (4/29) 06/04/2018  . Second degree perineal laceration 06/04/2018  . Rh negative state in antepartum period 11/20/2017  . Supervision of normal first pregnancy 11/18/2017    Augmentation: AROM, Pitocin  Complications: None  Date of Delivery: 06/04/2018 Delivered By: Dr. Billy Coast Delivery Type: spontaneous vaginal delivery Anesthesia: epidural Placenta: sponatneous Laceration: 2nd degree perineal  Episiotomy: none  Newborn Data: Live born female  Birth Weight: 7 lb 15.7 oz (3620 g) APGAR: 9, 9  Newborn Delivery   Birth date/time:  06/04/2018 11:06:00 Delivery type:  Vaginal, Spontaneous        Hospital/Postpartum Course  (Vaginal Delivery): Pt. Admitted in labor. She progressed with AROM and Pitocin.  See notes and delivery summary for details. Patient had an uncomplicated postpartum course.  By time of discharge on PPD#2, her pain was controlled on oral pain medications; she had appropriate lochia and was ambulating, voiding without difficulty and tolerating regular diet.  She was deemed stable for discharge to home.    Labs: CBC Latest Ref Rng & Units 06/05/2018 06/04/2018 05/22/2018   WBC 4.0 - 10.5 K/uL 15.5(H) 17.3(H) 12.6(H)  Hemoglobin 12.0 - 15.0 g/dL 5.1(Z) 11.9(L) 11.0(L)  Hematocrit 36.0 - 46.0 % 25.9(L) 34.9(L) 33.1(L)  Platelets 150 - 400 K/uL 136(L) 205 173   O NEG  Physical exam:  BP 116/60 (BP Location: Right Arm)   Pulse 71   Temp 98.3 F (36.8 C) (Oral)   Resp 16   Ht 5\' 5"  (1.651 m)   Wt 74.8 kg   LMP 09/01/2017 (Exact Date)   SpO2 98%   BMI 27.46 kg/m  General: alert and no distress Alert and oriented X3             Lungs: Clear and unlabored             Heart: regular rate and rhythm / (+) murmur noted             Abdomen: soft, non-tender, non-distended              Fundus: firm, non-tender, U-E             Perineum: well approximated 2nd degree, minimal edema, no ecchymosis, no erythema             Lochia: scant on pad, recently changed             Extremities: trace LE edema, no calf pain or tenderness Disposition: stable, discharge to home Baby Feeding: breast milk and formula Baby Disposition: home with mom  Rh Immune globulin given: Not indicated; baby RH Negative Rubella vaccine given: N/A Tdap vaccine given in AP or PP setting: Declined Flu vaccine given in AP or PP setting: Declined   Plan:  Katherine Miles was discharged to home in good condition. [facility].  Discharge Instructions: Per After Visit Summary. Refer to After Visit Summary and [facility]  Activity: Advance as tolerated. Pelvic rest for 6 weeks.   Diet: Regular, Heart Healthy Discharge Medications: Allergies as of 06/06/2018   No Known Allergies     Medication List    STOP taking these medications   promethazine 25 MG tablet Commonly known as:  PHENERGAN     TAKE these medications   ferrous sulfate 325 (65 FE) MG tablet Take 1 tablet (325 mg total) by mouth daily.   ibuprofen 600 MG tablet Commonly known as:  ADVIL Take 1 tablet (600 mg total) by mouth every 6 (six) hours.   Prenatal  Vitamins 0.8 MG tablet Take 1 tablet by mouth daily.            Discharge Care Instructions  (From admission, onward)         Start     Ordered   06/06/18 0000  Discharge wound care:    Comments:  Warm water sitz baths 2-3 times daily x 1 week   06/06/18 1025         Outpatient follow up:  Follow-up Information    [facility] [place].   Why:  Mom is calling Contact information: Fax [phone]       Olivia Mackieaavon, Richard, MD. Schedule an appointment as soon as possible for a visit in 6 week(s).   Specialty:  Obstetrics and Gynecology Why:  Postpartum visit Contact information: [address] [place] [id number] [phone]           Signed:  Carlean JewsMeredith , MSN, CNM [facility]

## 2018-06-06 NOTE — Lactation Note (Signed)
This note was copied from a baby's chart. Lactation Consultation Note:  LC arrived in Mothers room . Father reports that he just finished giving infant 25 ml of formula.   Mother reports that she still wants assistance with breastfeeding.  Mother assist to sit in the chair. Infant placed in cross cradle hold.  Assist mother with hand expression and observed a few drops of colostrum.  Infant latched on with ease. She was given 5 ml of formula with the curved tip syringe. Mother taught breast compression.  Infant sustained latch for 15 - 20 mins.  Lots of teaching with mother.  Mother taught proper application of #24 NS if unable to latch infant.  Mother advised to offer breast before formula with each feeding.  Mother to post pump after each feeding.   Discussed treatment and prevention of engorgement.   Advised mother to breastfeed with all feeding cues and allow infant to feed at least 8-12 times in 24 hours. Discussed cluster feeding.  Mother advised to follow up with LC OP with in the weeks.  Mother has available [facility] service information.   Patient Name: Katherine Miles'Y Date: 06/06/2018 Reason for consult: Follow-up assessment   Maternal Data    Feeding Feeding Type: Breast Fed Nipple Type: Extra Slow Flow  LATCH Score Latch: Grasps breast easily, tongue down, lips flanged, rhythmical sucking.  Audible Swallowing: A few with stimulation  Type of Nipple: Everted at rest and after stimulation  Comfort (Breast/Nipple): Soft / non-tender  Hold (Positioning): Assistance needed to correctly position infant at breast and maintain latch.  LATCH Score: 8  Interventions Interventions: Assisted with latch;Skin to skin;Breast massage;Hand express;Pre-pump if needed;Breast compression;Adjust position;Support pillows;Position options;Expressed milk;Hand pump;DEBP  Lactation Tools Discussed/Used     Consult Status Consult Status: Complete    Michel Bickers 06/06/2018, 9:50 AM

## 2018-06-07 LAB — TYPE AND SCREEN
ABO/RH(D): O NEG
Antibody Screen: POSITIVE
Unit division: 0
Unit division: 0

## 2018-06-07 LAB — BPAM RBC
Blood Product Expiration Date: 202005132359
Blood Product Expiration Date: 202005142359
Unit Type and Rh: 9500
Unit Type and Rh: 9500

## 2018-11-17 IMAGING — US US OB COMP LESS 14 WK
1 series · 14 of 28 positions shown · non-contrast
Comparison: None.

CLINICAL DATA: Pregnant, for viability

EXAM:
OBSTETRIC <14 WK ULTRASOUND
TECHNIQUE: Transabdominal ultrasound was performed for evaluation of the
gestation as well as the maternal uterus and adnexal regions.

[Series 1: us ob comp less 14 wk · 0.17mm/px · 14 of 80 slices shown]
[im 3/80]
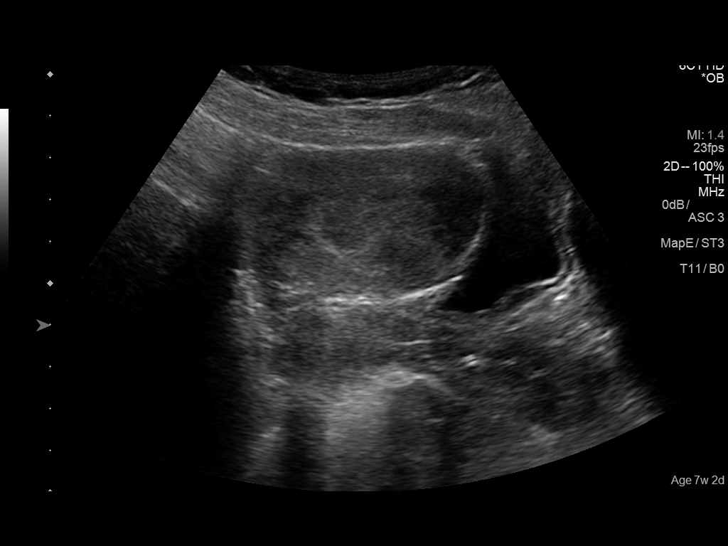
[im 9/80]
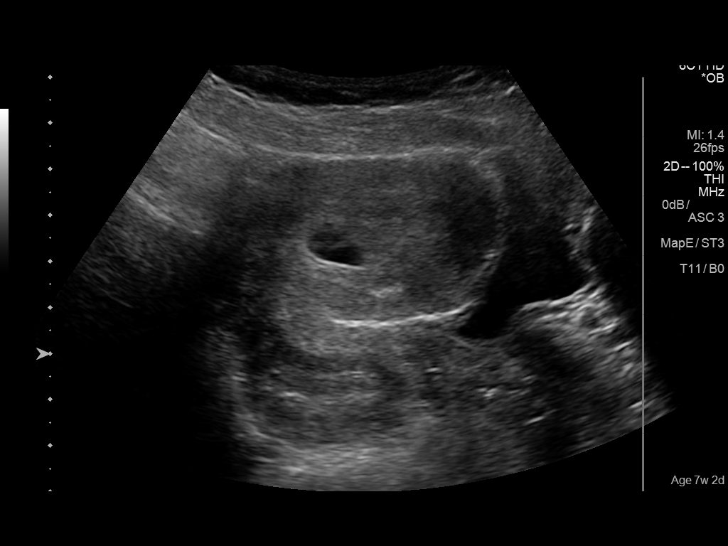
[im 15/80]
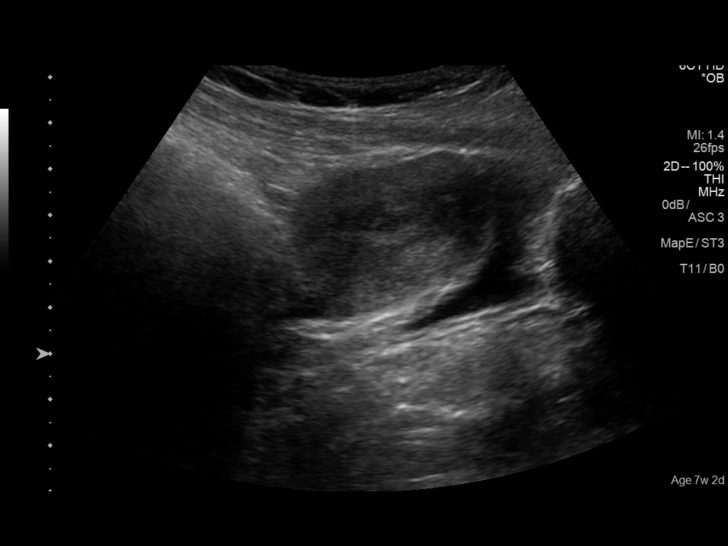
[im 21/80]
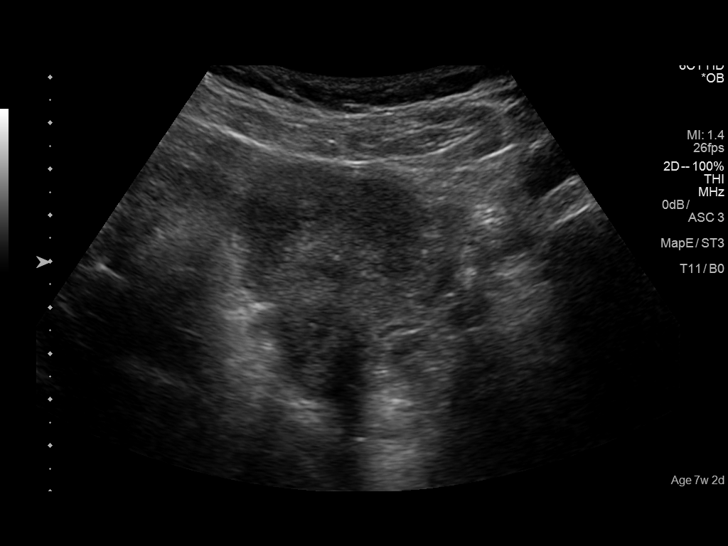
[im 27/80]
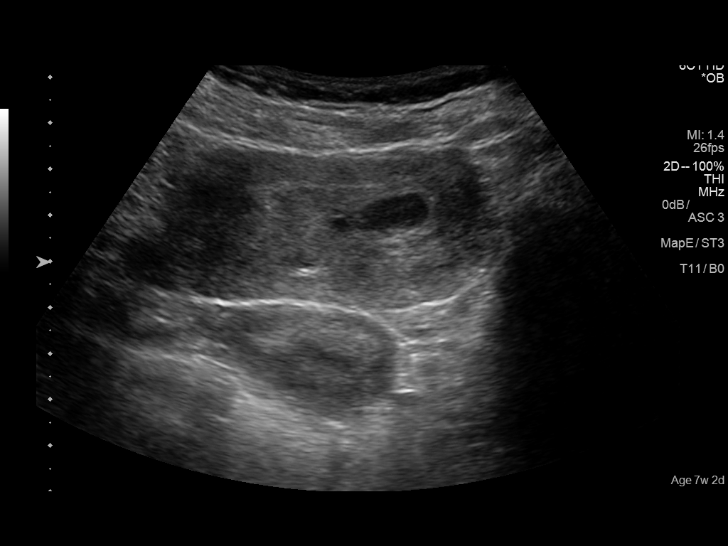
[im 33/80]
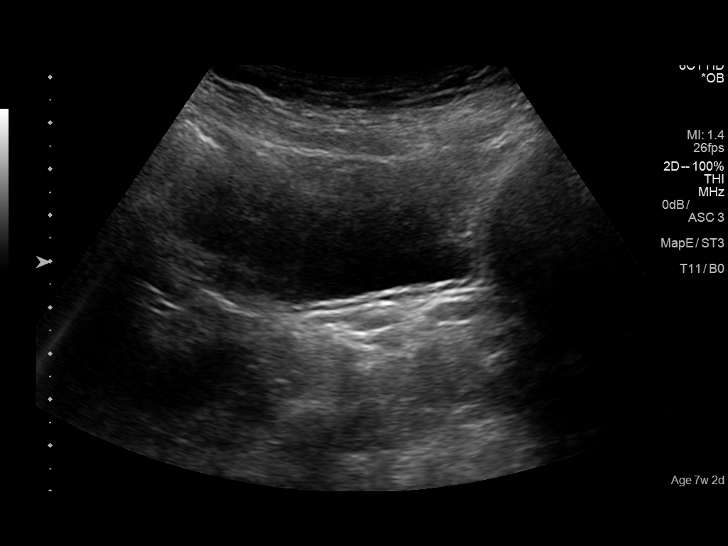
[im 39/80]
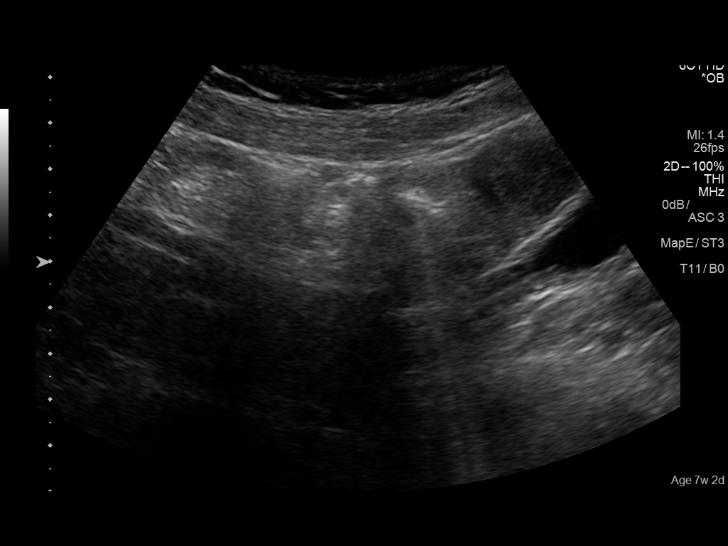
[im 44/80]
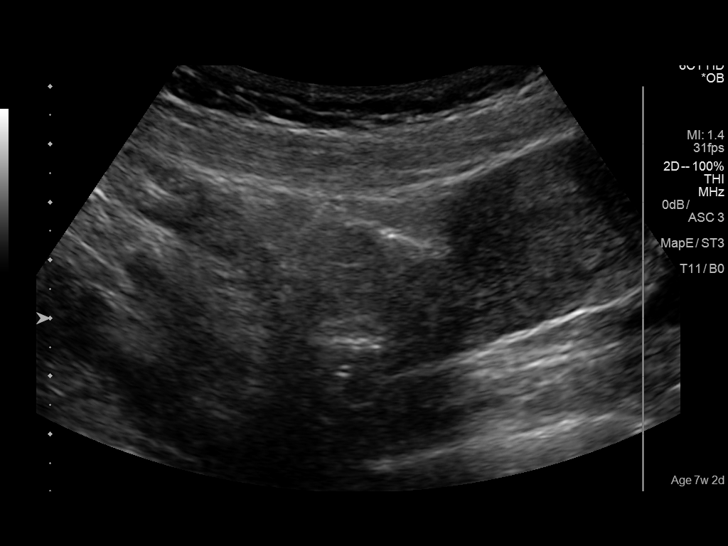
[im 50/80]
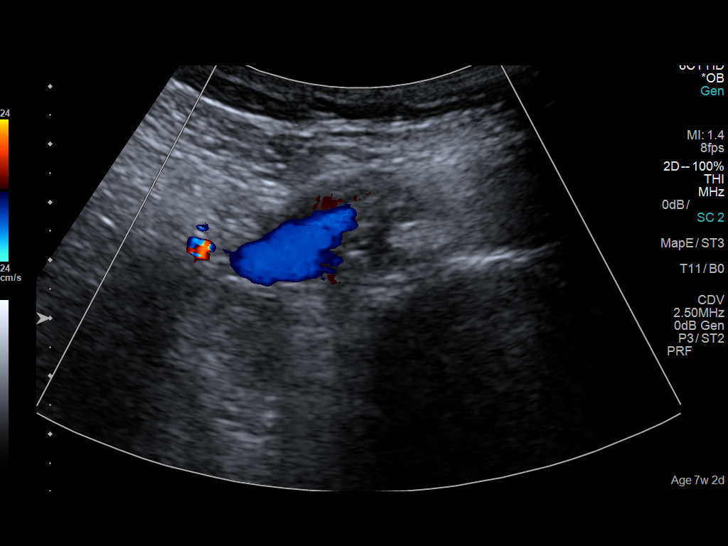
[im 56/80]
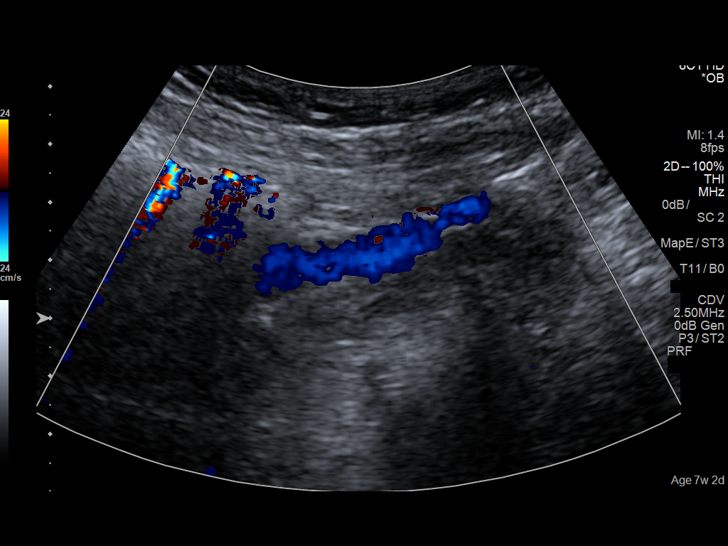
[im 62/80]
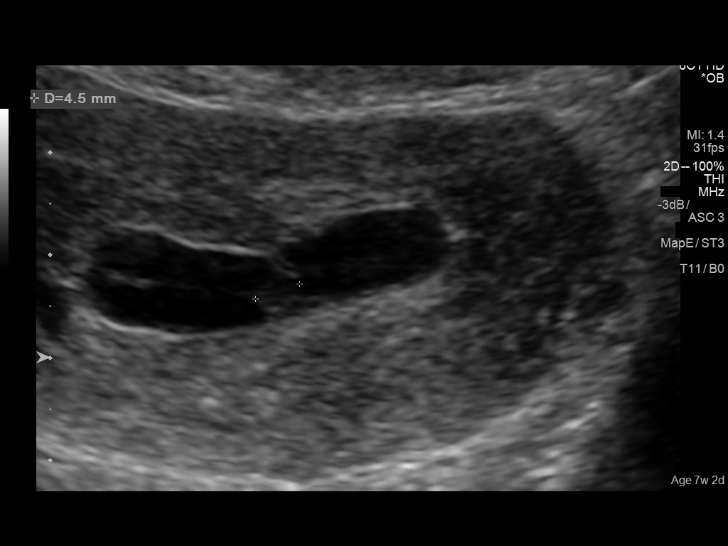
[im 68/80]
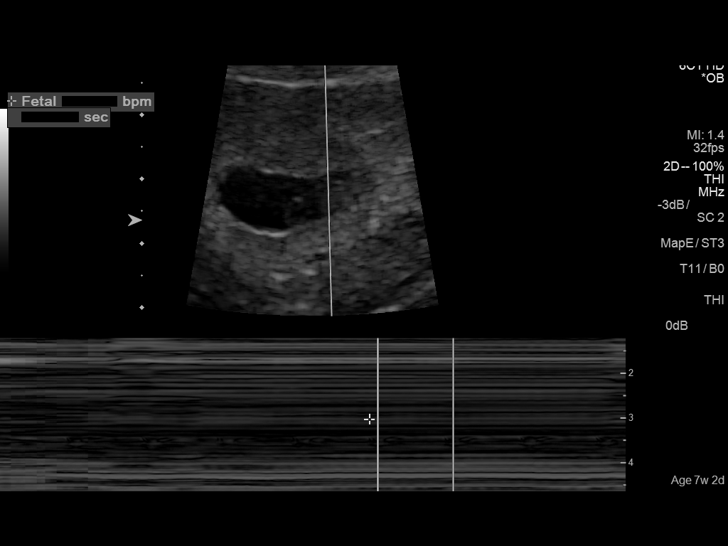
[im 74/80]
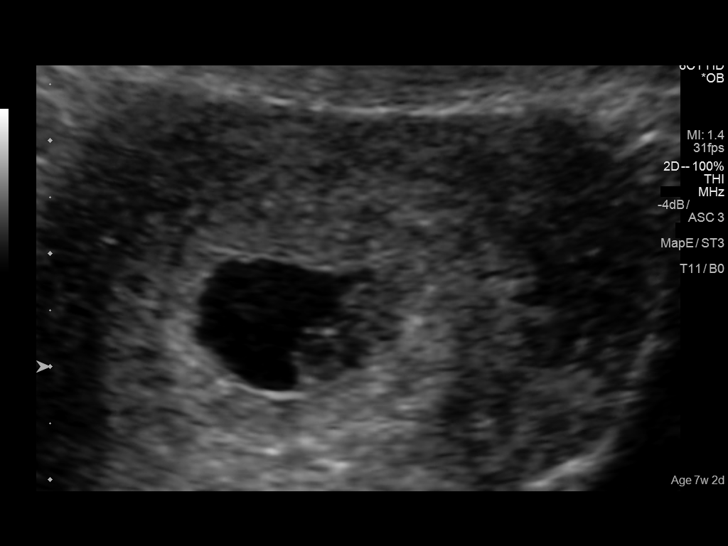
[im 80/80]
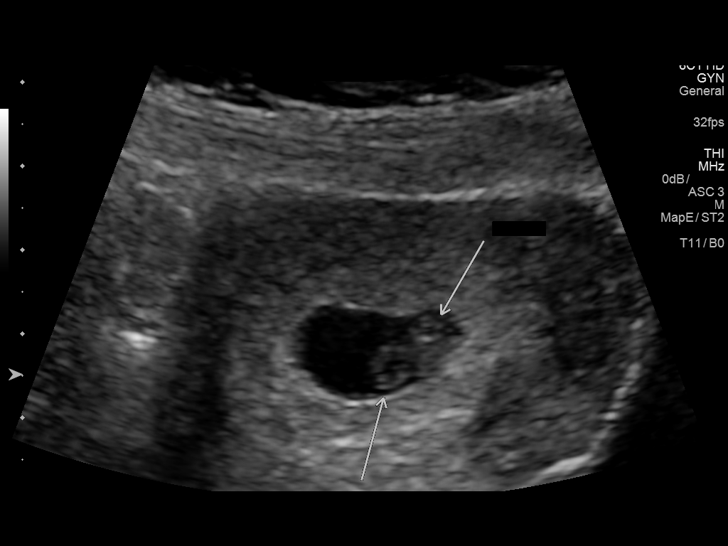

[14 of 28 positions shown; findings below may reference images not displayed]

FINDINGS: Intrauterine gestational sac: Single

Yolk sac:  Visualized.

Embryo:  Visualized.

Cardiac Activity: Visualized.

Heart Rate: 135 bpm

CRL:   6.7 mm   6 w 4 d                  US EDC: 06/13/2018

Subchorionic hemorrhage:  None visualized.

Maternal uterus/adnexae: Bilateral ovaries are not discretely
visualized.

No free fluid.
IMPRESSION: Single live intrauterine gestation, with estimated gestational age 6
weeks 4 days by crown-rump length, as above.

## 2019-12-09 ENCOUNTER — Other Ambulatory Visit: Payer: BLUE CROSS/BLUE SHIELD | Admitting: Adult Health

## 2019-12-10 ENCOUNTER — Other Ambulatory Visit: Payer: BLUE CROSS/BLUE SHIELD | Admitting: Adult Health

## 2020-01-12 ENCOUNTER — Other Ambulatory Visit: Payer: BLUE CROSS/BLUE SHIELD | Admitting: Adult Health

## 2020-02-15 ENCOUNTER — Other Ambulatory Visit: Payer: BC Managed Care – PPO | Admitting: Adult Health

## 2020-02-24 ENCOUNTER — Other Ambulatory Visit: Payer: BC Managed Care – PPO | Admitting: Adult Health

## 2020-07-07 ENCOUNTER — Other Ambulatory Visit (HOSPITAL_COMMUNITY)
Admission: RE | Admit: 2020-07-07 | Discharge: 2020-07-07 | Disposition: A | Discharge status: Home / Self Care | Payer: BC Managed Care – PPO | Source: Ambulatory Visit | Attending: Adult Health | Admitting: Adult Health

## 2020-07-07 ENCOUNTER — Ambulatory Visit (INDEPENDENT_AMBULATORY_CARE_PROVIDER_SITE_OTHER): Payer: BC Managed Care – PPO | Admitting: Adult Health

## 2020-07-07 ENCOUNTER — Other Ambulatory Visit: Payer: Self-pay

## 2020-07-07 ENCOUNTER — Encounter: Payer: Self-pay | Admitting: Adult Health

## 2020-07-07 VITALS — BP 122/64 | HR 75 | Ht 65.0 in | Wt 130.8 lb

## 2020-07-07 DIAGNOSIS — Z01419 Encounter for gynecological examination (general) (routine) without abnormal findings: Secondary | ICD-10-CM

## 2020-07-07 NOTE — Progress Notes (Signed)
Patient ID: Katherine Miles, female   DOB: [birth year], 35 y.o.   MRN: [id number] History of Present Illness: Katherine Miles is a 35 year old white female, married, G1P1 in for a well woman gyn exam and pap.    Current Medications, Allergies, Past Medical History, Past Surgical History, Family History and Social History were reviewed in [employer] record.     Review of Systems: Patient denies any headaches, hearing loss, fatigue, blurred vision, shortness of breath, chest pain, abdominal pain, problems with bowel movements, urination, or intercourse. No joint pain or mood swings.    Physical Exam:BP 122/64 (BP Location: Right Arm, Patient Position: Sitting, Cuff Size: Normal)   Pulse 75   Ht 5\' 5"  (1.651 m)   Wt 130 lb 12.8 oz (59.3 kg)   LMP 06/09/2020 (Exact Date)   Breastfeeding No   BMI 21.77 kg/m  General:  Well developed, well nourished, no acute distress Skin:  Warm and dry Neck:  Midline trachea, normal thyroid, good ROM, no lymphadenopathy Lungs; Clear to auscultation bilaterally Breast:  No dominant palpable mass, retraction, or nipple discharge Cardiovascular: Regular rate and rhythm Abdomen:  Soft, non tender, no hepatosplenomegaly Pelvic:  External genitalia is normal in appearance, no lesions.  The vagina is normal in appearance. Urethra has no lesions or masses. The cervix is bulbous,and everted at os, pap with HR HPV genotyping performed.  Uterus is felt to be normal size, shape, and contour.  No adnexal masses or tenderness noted.Bladder is non tender, no masses felt. Extremities/musculoskeletal:  No swelling or varicosities noted, no clubbing or cyanosis Psych:  No mood changes, alert and cooperative,seems happy AA is 0  Fall risk is low Depression screen [facility] 2/9 07/07/2020 11/18/2017 09/03/2017  Decreased Interest 0 2 0  Down, Depressed, Hopeless 0 1 0  PHQ - 2 Score 0 3 0  Altered sleeping 0 2 -  Tired, decreased energy 0 3 -  Change in appetite 0 2 -   Feeling bad or failure about yourself  0 0 -  Trouble concentrating 0 0 -  Moving slowly or fidgety/restless 0 0 -  Suicidal thoughts 0 0 -  PHQ-9 Score 0 10 -   GAD 7 : Generalized Anxiety Score 07/07/2020  Nervous, Anxious, on Edge 0  Control/stop worrying 0  Worry too much - different things 0  Trouble relaxing 0  Restless 0  Easily annoyed or irritable 0  Afraid - awful might happen 0  Total GAD 7 Score 0    Upstream - 07/07/20 1142      Pregnancy Intention Screening   Does the patient want to become pregnant in the next year? No    Does the patient's partner want to become pregnant in the next year? No    Would the patient like to discuss contraceptive options today? No      Contraception Wrap Up   Current Method Vasectomy    End Method Vasectomy    Contraception Counseling Provided No         Examination chaperoned by 09/06/20 RN  Impression and Plan: 1. Encounter for gynecological examination with Papanicolaou smear of cervix Physical and fasting labs in 1 year Pap sent, pap in 3 years of normal Mammogram at 40.

## 2020-07-11 LAB — CYTOLOGY - PAP
Comment: NEGATIVE
Diagnosis: NEGATIVE
High risk HPV: NEGATIVE

## 2021-07-24 ENCOUNTER — Ambulatory Visit (INDEPENDENT_AMBULATORY_CARE_PROVIDER_SITE_OTHER): Payer: BC Managed Care – PPO | Admitting: Adult Health

## 2021-07-24 ENCOUNTER — Encounter: Payer: Self-pay | Admitting: Adult Health

## 2021-07-24 VITALS — BP 113/80 | HR 69 | Ht 65.0 in | Wt 127.5 lb

## 2021-07-24 DIAGNOSIS — F419 Anxiety disorder, unspecified: Secondary | ICD-10-CM

## 2021-07-24 DIAGNOSIS — F32A Depression, unspecified: Secondary | ICD-10-CM | POA: Insufficient documentation

## 2021-07-24 DIAGNOSIS — Z01419 Encounter for gynecological examination (general) (routine) without abnormal findings: Secondary | ICD-10-CM

## 2021-07-24 MED ORDER — SERTRALINE HCL 50 MG PO TABS: 50.0000 mg | ORAL_TABLET | Freq: Every day | ORAL | 3 refills | Status: DC

## 2021-07-24 NOTE — Progress Notes (Signed)
Patient ID: Katherine Miles, female   DOB: [birth year], 36 y.o.   MRN: [id number] History of Present Illness: Katherine Miles is a 36 year old white female, married, G1P1 in for a well woman gyn exam. Has felt anxious, and less energy, has new job and daughter, Renea Ee who is 3 is getting tested for autism.  She has no PCP currently.  Lab Results  Component Value Date   DIAGPAP  07/07/2020    - Negative for intraepithelial lesion or malignancy (NILM)   HPVHIGH Negative 07/07/2020    Current Medications, Allergies, Past Medical History, Past Surgical History, Family History and Social History were reviewed in [employer] record.     Review of Systems: Patient denies any headaches, hearing loss, fatigue, blurred vision, shortness of breath, chest pain, abdominal pain, problems with bowel movements, urination, or intercourse. No joint pain or mood swings.  See HPI for positives.   Physical Exam:BP 113/80 (BP Location: Right Arm, Patient Position: Sitting, Cuff Size: Normal)   Pulse 69   Ht 5\' 5"  (1.651 m)   Wt 127 lb 8 oz (57.8 kg)   LMP 07/23/2021   BMI 21.22 kg/m   General:  Well developed, well nourished, no acute distress Skin:  Warm and dry Neck:  Midline trachea, normal thyroid, good ROM, no lymphadenopathy Lungs; Clear to auscultation bilaterally Breast:  No dominant palpable mass, retraction, or nipple discharge Cardiovascular: Regular rate and rhythm Abdomen:  Soft, non tender, no hepatosplenomegaly Pelvic:  External genitalia is normal in appearance, no lesions. Has small skin tags both inner thighs.  The vagina is normal in appearance.+period blood. Urethra has no lesions or masses. The cervix is bulbous.  Uterus is felt to be normal size, shape, and contour.  No adnexal masses or tenderness noted.Bladder is non tender, no masses felt. Extremities/musculoskeletal:  No swelling or varicosities noted, no clubbing or cyanosis Psych:  No mood changes, alert and  cooperative,seems happy AA is 0  Fall risk is low    07/24/2021   10:38 AM 07/07/2020   11:38 AM 11/18/2017    3:36 PM  Depression screen PHQ 2/9  Decreased Interest 2 0 2  Down, Depressed, Hopeless 1 0 1  PHQ - 2 Score 3 0 3  Altered sleeping 2 0 2  Tired, decreased energy 3 0 3  Change in appetite 0 0 2  Feeling bad or failure about yourself  1 0 0  Trouble concentrating 1 0 0  Moving slowly or fidgety/restless 0 0 0  Suicidal thoughts 0 0 0  PHQ-9 Score 10 0 10       07/24/2021   10:41 AM 07/07/2020   11:39 AM  GAD 7 : Generalized Anxiety Score  Nervous, Anxious, on Edge 2 0  Control/stop worrying 2 0  Worry too much - different things 2 0  Trouble relaxing 2 0  Restless 2 0  Easily annoyed or irritable 2 0  Afraid - awful might happen 1 0  Total GAD 7 Score 13 0      Upstream - 07/24/21 1038       Pregnancy Intention Screening   Does the patient want to become pregnant in the next year? No    Does the patient's partner want to become pregnant in the next year? No    Would the patient like to discuss contraceptive options today? No      Contraception Wrap Up   Current Method Vasectomy    End Method Vasectomy  Contraception Counseling Provided No            Examination chaperoned by Faith Rogue LPN   Impression and Plan: 1. Encounter for well woman exam with routine gynecological exam Physical in 1 year Pap in 2025 Will check fasting labs today - CBC - Comprehensive metabolic panel - TSH - Lipid panel  2. Anxiety and depression -feels anxious and has less energy, has new job and family stress Had discussion and will try Zoloft 50 mg take 1/2 tablet first week then 1 daily Meds ordered this encounter  Medications   sertraline (ZOLOFT) 50 MG tablet    Sig: Take 1 tablet (50 mg total) by mouth daily.    Dispense:  30 tablet    Refill:  3    Order Specific Question:   Supervising Provider    Answer:   Duane Lope H [[id number]]   Follow up with me  in 8 weeks for ROS or sooner if needed

## 2021-07-25 LAB — COMPREHENSIVE METABOLIC PANEL
ALT: 14 IU/L (ref 0–32)
AST: 18 IU/L (ref 0–40)
Albumin/Globulin Ratio: 1.7 (ref 1.2–2.2)
Albumin: 4.9 g/dL — ABNORMAL HIGH (ref 3.8–4.8)
Alkaline Phosphatase: 68 IU/L (ref 44–121)
BUN/Creatinine Ratio: 15 (ref 9–23)
BUN: 12 mg/dL (ref 6–20)
Bilirubin Total: 0.4 mg/dL (ref 0.0–1.2)
CO2: 23 mmol/L (ref 20–29)
Calcium: 9.8 mg/dL (ref 8.7–10.2)
Chloride: 104 mmol/L (ref 96–106)
Creatinine, Ser: 0.78 mg/dL (ref 0.57–1.00)
Globulin, Total: 2.9 g/dL (ref 1.5–4.5)
Glucose: 90 mg/dL (ref 70–99)
Potassium: 4.4 mmol/L (ref 3.5–5.2)
Sodium: 142 mmol/L (ref 134–144)
Total Protein: 7.8 g/dL (ref 6.0–8.5)
eGFR: 101 mL/min/{1.73_m2} (ref >59)

## 2021-07-25 LAB — CBC
Hematocrit: 40 % (ref 34.0–46.6)
Hemoglobin: 13.6 g/dL (ref 11.1–15.9)
MCH: 30.9 pg (ref 26.6–33.0)
MCHC: 34 g/dL (ref 31.5–35.7)
MCV: 91 fL (ref 79–97)
Platelets: 250 10*3/uL (ref 150–450)
RBC: 4.4 x10E6/uL (ref 3.77–5.28)
RDW: 12.3 % (ref 11.7–15.4)
WBC: 5.2 10*3/uL (ref 3.4–10.8)

## 2021-07-25 LAB — TSH: TSH: 1.6 u[IU]/mL (ref 0.450–4.500)

## 2021-07-25 LAB — LIPID PANEL
Chol/HDL Ratio: 2.2 ratio (ref 0.0–4.4)
Cholesterol, Total: 182 mg/dL (ref 100–199)
HDL: 83 mg/dL (ref >39)
LDL Chol Calc (NIH): 89 mg/dL (ref 0–99)
Triglycerides: 53 mg/dL (ref 0–149)
VLDL Cholesterol Cal: 10 mg/dL (ref 5–40)

## 2022-03-26 ENCOUNTER — Other Ambulatory Visit (HOSPITAL_COMMUNITY)
Admission: RE | Admit: 2022-03-26 | Discharge: 2022-03-26 | Disposition: A | Discharge status: Home / Self Care | Payer: BC Managed Care – PPO | Source: Ambulatory Visit | Attending: Adult Health | Admitting: Adult Health

## 2022-03-26 ENCOUNTER — Encounter: Payer: Self-pay | Admitting: Adult Health

## 2022-03-26 ENCOUNTER — Ambulatory Visit (INDEPENDENT_AMBULATORY_CARE_PROVIDER_SITE_OTHER): Payer: BC Managed Care – PPO | Admitting: Adult Health

## 2022-03-26 VITALS — BP 106/70 | HR 71 | Ht 65.0 in | Wt 129.0 lb

## 2022-03-26 DIAGNOSIS — L918 Other hypertrophic disorders of the skin: Secondary | ICD-10-CM | POA: Insufficient documentation

## 2022-03-26 NOTE — Progress Notes (Signed)
  Subjective:     Patient ID: Katherine Miles, female   DOB: April 06, 1985, 37 y.o.   MRN: [id number]  HPI Katherine Miles is a 37 year old white female,married, G1P1001, in thinking of wanting skin tags removed.  Last pap was negative HPV, NILM 07/07/20  Review of Systems Has skin tags in pantie line area Reviewed past medical,surgical, social and family history. Reviewed medications and allergies.     Objective:   Physical Exam BP 106/70 (BP Location: Left Arm, Patient Position: Sitting, Cuff Size: Normal)   Pulse 71   Ht 5' 5"$  (1.651 m)   Wt 129 lb (58.5 kg)   LMP 02/24/2022 (Approximate)   BMI 21.47 kg/m     Consent signed and time out called. Skin warm and dry has 2 skin tags right pantie line area and 1 on left She wants to remove the 2 on right today. Area cleansed with alcohol swab, injected with 1.5 cc lidocaine at base each tag, clamped with small curved forceps at base then used iris scissors to clip, then applied silver nitrate for minute bleeding, and band aide. Skin tags sent to pathology. Examination chaperoned by Levy Pupa LPN  Fall risk is low  Upstream - 03/26/22 0936       Pregnancy Intention Screening   Does the patient want to become pregnant in the next year? No    Does the patient's partner want to become pregnant in the next year? No    Would the patient like to discuss contraceptive options today? No      Contraception Wrap Up   Current Method Vasectomy    End Method Vasectomy    Contraception Counseling Provided No             Assessment:     1. Skin tags, multiple acquired 2 skin tags removed in right pantie line area and sent to pathology    Plan:     Follow up prn

## 2022-03-28 LAB — SURGICAL PATHOLOGY

## 2022-08-03 ENCOUNTER — Ambulatory Visit: Payer: BC Managed Care – PPO | Admitting: Adult Health

## 2022-09-14 ENCOUNTER — Ambulatory Visit: Payer: BC Managed Care – PPO | Admitting: Adult Health

## 2022-10-04 ENCOUNTER — Ambulatory Visit (INDEPENDENT_AMBULATORY_CARE_PROVIDER_SITE_OTHER): Payer: BC Managed Care – PPO | Admitting: Adult Health

## 2022-10-04 ENCOUNTER — Encounter: Payer: Self-pay | Admitting: Adult Health

## 2022-10-04 VITALS — BP 112/75 | HR 65 | Ht 65.0 in | Wt 126.5 lb

## 2022-10-04 DIAGNOSIS — F439 Reaction to severe stress, unspecified: Secondary | ICD-10-CM | POA: Diagnosis not present

## 2022-10-04 DIAGNOSIS — Z01419 Encounter for gynecological examination (general) (routine) without abnormal findings: Secondary | ICD-10-CM | POA: Diagnosis not present

## 2022-10-04 NOTE — Progress Notes (Signed)
Patient ID: Katherine Miles, female   DOB: Jul 18, 1985, 37 y.o.   MRN: [id number] History of Present Illness: Katherine Miles is a 37 year old white female, married, G1P1001, in for a well woman gyn exam. 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       Component Value Date/Time   DIAGPAP  07/07/2020 1144    - Negative for intraepithelial lesion or malignancy (NILM)   HPVHIGH Negative 07/07/2020 1144   ADEQPAP  07/07/2020 1144    Satisfactory for evaluation; transformation zone component PRESENT.      Current Medications, Allergies, Past Medical History, Past Surgical History, Family History and Social History were reviewed in [employer] record.     Review of Systems: Patient denies any headaches, hearing loss, fatigue, blurred vision, shortness of breath,  abdominal pain, problems with bowel movements, urination, or intercourse. No joint pain or mood swings.  Has had some chest heaviness that she thinks is stress related and left arm felt numb a few times.   Physical Exam:BP 112/75 (BP Location: Left Arm, Patient Position: Sitting, Cuff Size: Normal)   Pulse 65   Ht 5\' 5"  (1.651 m)   Wt 126 lb 8 oz (57.4 kg)   LMP 10/01/2022   BMI 21.05 kg/m   General:  Well developed, well nourished, no acute distress Skin:  Warm and dry Neck:  Midline trachea, normal thyroid, good ROM, no lymphadenopathy Lungs; Clear to auscultation bilaterally Breast:  No dominant palpable mass, retraction, or nipple discharge Cardiovascular: Regular rate and rhythm Abdomen:  Soft, non tender, no hepatosplenomegaly Pelvic:  External genitalia is normal in appearance, no lesions.  The vagina is normal in appearance. Urethra has no lesions or masses. The cervix is smooth.  Uterus is felt to be normal size, shape, and contour.  No adnexal masses or tenderness noted.Bladder is  non tender, no masses felt. Rectal: Deferred Extremities/musculoskeletal:  No swelling or varicosities noted, no clubbing or cyanosis Psych:  No mood changes, alert and cooperative,seems happy AA is 2 Fall risk is low    10/04/2022    8:46 AM 07/24/2021   10:38 AM 07/07/2020   11:38 AM  Depression screen PHQ 2/9  Decreased Interest 1 2 0  Down, Depressed, Hopeless 0 1 0  PHQ - 2 Score 1 3 0  Altered sleeping 0 2 0  Tired, decreased energy 2 3 0  Change in appetite 0 0 0  Feeling bad or failure about yourself  0 1 0  Trouble concentrating 0 1 0  Moving slowly or fidgety/restless 0 0 0  Suicidal thoughts 0 0 0  PHQ-9 Score 3 10 0       10/04/2022    8:46 AM 07/24/2021   10:41 AM 07/07/2020   11:39 AM  GAD 7 : Generalized Anxiety Score  Nervous, Anxious, on Edge 1 2 0  Control/stop worrying 1 2 0  Worry too much - different things 2 2 0  Trouble relaxing 2 2 0  Restless 0 2 0  Easily annoyed or irritable 1 2 0  Afraid - awful might happen 0 1 0  Total GAD 7 Score 7 13 0      Upstream - 10/04/22 0845       Pregnancy Intention Screening   Does the patient want to become pregnant in the next year? No  Does the patient's partner want to become pregnant in the next year? No    Would the patient like to discuss contraceptive options today? No      Contraception Wrap Up   Current Method Vasectomy    End Method Vasectomy    Contraception Counseling Provided No             Examination chaperoned by Malachy Mood LPN  Impression and plan: 1. Encounter for well woman exam with routine gynecological exam Pap and physical in 1 year Labs next year, were normal in 2023   2. 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

## 2023-03-25 ENCOUNTER — Encounter: Payer: Self-pay | Admitting: Adult Health

## 2023-03-25 ENCOUNTER — Other Ambulatory Visit (HOSPITAL_COMMUNITY)
Admission: RE | Admit: 2023-03-25 | Discharge: 2023-03-25 | Disposition: A | Discharge status: Home / Self Care | Payer: BC Managed Care – PPO | Source: Ambulatory Visit | Attending: Adult Health | Admitting: Adult Health

## 2023-03-25 ENCOUNTER — Ambulatory Visit: Payer: BC Managed Care – PPO | Admitting: Adult Health

## 2023-03-25 VITALS — BP 112/72 | HR 74 | Ht 65.0 in | Wt 130.4 lb

## 2023-03-25 DIAGNOSIS — L918 Other hypertrophic disorders of the skin: Secondary | ICD-10-CM

## 2023-03-25 NOTE — Addendum Note (Signed)
Addended by: Annamarie Dawley on: 03/25/2023 12:07 PM   Modules accepted: Orders

## 2023-03-25 NOTE — Progress Notes (Signed)
  Subjective:     Patient ID: Katherine Miles, female   DOB: [birth year], 38 y.o.   MRN: [id number]  HPI Katherine Miles is a 38 year old white female, married, G1P1001, in for skin tag removal in left groin area.     Component Value Date/Time   DIAGPAP  07/07/2020 1144    - Negative for intraepithelial lesion or malignancy (NILM)   HPVHIGH Negative 07/07/2020 1144   ADEQPAP  07/07/2020 1144    Satisfactory for evaluation; transformation zone component PRESENT.     Review of Systems For skin tag removal   Reviewed past medical,surgical, social and family history. Reviewed medications and allergies.  Objective:   Physical Exam BP 112/72 (BP Location: Right Arm, Patient Position: Sitting, Cuff Size: Normal)   Pulse 74   Ht 5\' 5"  (1.651 m)   Wt 130 lb 6.4 oz (59.1 kg)   BMI 21.70 kg/m     Consent signed and time out called. Skin warm and dry, has small skin on left in panty line that she wants removed. Area cleansed with alcohol swab and injected with 1.5 cc 1 % lidocaine at base, cleaned with betadine, and small curved forceps applied to base then used iris scissors to clip, no bleeding and band aide applied. Skin tag sent to pathology. Examination chaperoned by Faith Rogue LPN  Upstream - 03/25/23 0901       Pregnancy Intention Screening   Does the patient want to become pregnant in the next year? No    Does the patient's partner want to become pregnant in the next year? No    Would the patient like to discuss contraceptive options today? No      Contraception Wrap Up   Current Method Vasectomy    End Method Vasectomy    Contraception Counseling Provided No             Assessment:     1. Skin tag (Primary),removed Skin tag removed and sent to pathology    Plan:     Follow up prn

## 2023-03-27 LAB — SURGICAL PATHOLOGY

## 2023-08-06 HISTORY — PX: PLACEMENT OF BREAST IMPLANTS: SHX6334

## 2023-10-30 ENCOUNTER — Ambulatory Visit: Admitting: Adult Health

## 2023-10-30 ENCOUNTER — Other Ambulatory Visit (HOSPITAL_COMMUNITY)
Admission: RE | Admit: 2023-10-30 | Discharge: 2023-10-30 | Disposition: A | Discharge status: Home / Self Care | Source: Ambulatory Visit | Attending: Adult Health | Admitting: Adult Health

## 2023-10-30 ENCOUNTER — Encounter: Payer: Self-pay | Admitting: Adult Health

## 2023-10-30 VITALS — BP 119/78 | HR 73 | Ht 65.0 in | Wt 129.5 lb

## 2023-10-30 DIAGNOSIS — Z01419 Encounter for gynecological examination (general) (routine) without abnormal findings: Secondary | ICD-10-CM | POA: Insufficient documentation

## 2023-10-30 DIAGNOSIS — Z1331 Encounter for screening for depression: Secondary | ICD-10-CM

## 2023-10-30 DIAGNOSIS — Z1151 Encounter for screening for human papillomavirus (HPV): Secondary | ICD-10-CM | POA: Diagnosis not present

## 2023-10-30 NOTE — Progress Notes (Signed)
 Patient ID: Katherine Miles, female   DOB: [birth year], 38 y.o.   MRN: [id number] History of Present Illness: Katherine Miles is a 38 year old white female, married(but was separated for short period), G1P1001, in for a well woman gyn exam and pap. She got breast implants in July 2025. Hargis her  38 yo daughter broke her leg on her scooter but is doing well.  Current Medications, Allergies, Past Medical History, Past Surgical History, Family History and Social History were reviewed in [employer] record.     Review of Systems: Patient denies any headaches, hearing loss, fatigue, blurred vision, shortness of breath, chest pain, abdominal pain, problems with bowel movements, urination, or intercourse. No joint pain or mood swings.     Physical Exam:BP 119/78 (BP Location: Left Arm, Patient Position: Sitting, Cuff Size: Normal)   Pulse 73   Ht 5' 5 (1.651 m)   Wt 129 lb 8 oz (58.7 kg)   LMP 10/16/2023 (Exact Date)   BMI 21.55 kg/m   General:  Well developed, well nourished, no acute distress Skin:  Warm and dry Neck:  Midline trachea, normal thyroid, good ROM, no lymphadenopathy Lungs; Clear to auscultation bilaterally Breast:  No dominant palpable mass, retraction, or nipple discharge, has bilateral implants  Cardiovascular: Regular rate and rhythm Abdomen:  Soft, non tender, no hepatosplenomegaly Pelvic:  External genitalia is normal in appearance, no lesions.  The vagina is normal in appearance. Urethra has no lesions or masses. The cervix is everted at the os, pap with HR HPV genotyping performed.  Uterus is felt to be normal size, shape, and contour.  No adnexal masses or tenderness noted.Bladder is non tender, no masses felt. Extremities/musculoskeletal:  No swelling or varicosities noted, no clubbing or cyanosis Psych:  No mood changes, alert and cooperative,seems happy AA is 0 Fall risk is low    10/30/2023    9:38 AM 10/04/2022    8:46 AM 07/24/2021   10:38 AM   Depression screen PHQ 2/9  Decreased Interest 1 1 2   Down, Depressed, Hopeless 0 0 1  PHQ - 2 Score 1 1 3   Altered sleeping 0 0 2  Tired, decreased energy 2 2 3   Change in appetite 0 0 0  Feeling bad or failure about yourself  0 0 1  Trouble concentrating 0 0 1  Moving slowly or fidgety/restless 0 0 0  Suicidal thoughts 0 0 0  PHQ-9 Score 3 3 10        10/30/2023    9:38 AM 10/04/2022    8:46 AM 07/24/2021   10:41 AM 07/07/2020   11:39 AM  GAD 7 : Generalized Anxiety Score  Nervous, Anxious, on Edge 0 1 2 0  Control/stop worrying 0 1 2 0  Worry too much - different things 1 2 2  0  Trouble relaxing 0 2 2 0  Restless 0 0 2 0  Easily annoyed or irritable 0 1 2 0  Afraid - awful might happen 0 0 1 0  Total GAD 7 Score 1 7 13  0      Upstream - 10/30/23 0946       Pregnancy Intention Screening   Does the patient want to become pregnant in the next year? No    Does the patient's partner want to become pregnant in the next year? No    Would the patient like to discuss contraceptive options today? No      Contraception Wrap Up   Current Method Vasectomy  End Method Vasectomy    Contraception Counseling Provided No          Examination chaperoned by Katherine Salt LPN  Impression and plan: 1. Encounter for gynecological examination with Papanicolaou smear of cervix (Primary) Pap sent Pap in 3 years if normal Physical and labs in 1 year  Stay active  - Cytology - PAP( )

## 2023-11-04 ENCOUNTER — Ambulatory Visit: Payer: Self-pay | Admitting: Adult Health

## 2023-11-04 LAB — CYTOLOGY - PAP
Comment: NEGATIVE
Diagnosis: NEGATIVE
Diagnosis: REACTIVE
High risk HPV: NEGATIVE

## 2023-11-05 ENCOUNTER — Ambulatory Visit
Admission: EM | Admit: 2023-11-05 | Discharge: 2023-11-05 | Disposition: A | Discharge status: Home / Self Care | Attending: Family Medicine | Admitting: Family Medicine

## 2023-11-05 ENCOUNTER — Ambulatory Visit: Payer: Self-pay

## 2023-11-05 ENCOUNTER — Encounter: Payer: Self-pay | Admitting: Emergency Medicine

## 2023-11-05 DIAGNOSIS — F419 Anxiety disorder, unspecified: Secondary | ICD-10-CM

## 2023-11-05 DIAGNOSIS — R002 Palpitations: Secondary | ICD-10-CM | POA: Diagnosis not present

## 2023-11-05 DIAGNOSIS — M79622 Pain in left upper arm: Secondary | ICD-10-CM

## 2023-11-05 NOTE — Discharge Instructions (Signed)
 Your vital signs and exam are very reassuring today, however we are unable to rule out more emergent causes of your pain in this setting as discussed during your visit.  If your symptoms persist or worsen, I recommend going to the emergency department for further evaluation.  Your arm pain does appear to be more musculoskeletal in nature, I recommend heat, massage, over-the-counter pain relievers and rest.  Regarding your palpitations, possibly anxiety related but do recommend close primary care or cardiology follow-up for further evaluation into this.

## 2023-11-05 NOTE — Telephone Encounter (Signed)
 FYI Only or Action Required?: FYI only for provider.  Patient was last seen in primary care on New Pt.  Called Nurse Triage reporting Chest Pain, Arm Injury, and New Patient (Initial Visit).  Symptoms began about a month ago.  Interventions attempted: Rest, hydration, or home remedies.  Symptoms are: unchanged.  Triage Disposition: See Physician Within 24 Hours  Patient/caregiver understands and will follow disposition?: Unsure       Copied from CRM #[id number]. Topic: Clinical - Red Word Triage >> Nov 05, 2023 12:31 PM Corin V wrote: [facility] that prompted transfer to Nurse Triage: Patient is having chest pain and left arm pain. She is not having the chest pain at this moment and thinks it's from stress and anxiety.  [facility] loan medi Reason for Disposition  [1] Chest pain lasts > 5 minutes AND [2] occurred > 3 days ago (72 hours) AND [3] NO chest pain or cardiac symptoms now    Pt initially called to establish care with [facility], but experiencing sx, so transferred to NT.  Upon further investigation, pt is c/o CP and L arm numbness-- but no longer experiencing CP. Triager strongly advised UC for further evaluation/tx. Triager offered to schedule with [facility], but pt declined.  Triager advised that once pt is evaluated for acute issue, she can call back and schedule New Pt appt with [facility]. Patient verbalized understanding .  Answer Assessment - Initial Assessment Questions 1. LOCATION: Where does it hurt?       Right up above breast not really like a pain, pain... but I know that's a pressure. Denies any sx at this time right now I feel great 2. RADIATION: Does the pain go anywhere else? (e.g., into neck, jaw, arms, back)     L arm - like an achy, numbness feeling 3. ONSET: When did the chest pain begin? (Minutes, hours or days)      X 1 month 4. PATTERN: Does the pain come and go, or has it been constant since it started?  Does it get worse with  exertion?      Comes and goes 5. DURATION: How long does it last (e.g., seconds, minutes, hours)     A little while - a few hours 6. SEVERITY: How bad is the pain?  (e.g., Scale 1-10; mild, moderate, or severe)     Endorses pressure 7. CARDIAC RISK FACTORS: Do you have any history of heart problems or risk factors for heart disease? (e.g., angina, prior heart attack; diabetes, high blood pressure, high cholesterol, smoker, or strong family history of heart disease)     Denies. Does endorse grandfather had heart attack in early 36s. 8. PULMONARY RISK FACTORS: Do you have any history of lung disease?  (e.g., blood clots in lung, asthma, emphysema, birth control pills)     denies 9. CAUSE: What do you think is causing the chest pain?     Stress - endorses lifestyle changes with meditation, etc. 10. OTHER SYMPTOMS: Do you have any other symptoms? (e.g., dizziness, nausea, vomiting, sweating, fever, difficulty breathing, cough)       Endorses palpitations last week during work that self-resolved. 11. PREGNANCY: Is there any chance you are pregnant? When was your last menstrual period?       N/a  Protocols used: Chest Pain-A-AH

## 2023-11-05 NOTE — ED Triage Notes (Signed)
 Left arm achy and numb x 2 weeks.  States has had some heart palpitations.  States she does feel some heaviness in her chest

## 2023-11-08 NOTE — ED Provider Notes (Signed)
 [facility]    CSN: [id number] Arrival date & time: 11/05/23  1725      History   Chief Complaint No chief complaint on file.   HPI Katherine Miles is a 38 y.o. female.   Patient presenting today with 2-week history of left arm achiness and occasional numbness, intermittent heaviness in her chest which she states is not altogether abnormal for her, and several days of intermittent heart palpitations.  Denies wheezing, shortness of breath, abdominal pain, vomiting, diarrhea, diaphoresis, fevers, upper respiratory symptoms.  No known chronic cardiopulmonary conditions.  So far not trying thing over-the-counter for symptoms.  She does note that she has a history of anxiety and is under a significant amount of stress recently.  Has had panic episodes that have felt similar to this.    Past Medical History:  Diagnosis Date   Vaginal Pap smear, abnormal     Patient Active Problem List   Diagnosis Date Noted   Encounter for gynecological examination with Papanicolaou smear of cervix 10/30/2023   Stress 10/04/2022   Skin tags, multiple acquired 03/26/2022   Anxiety and depression 07/24/2021   Normal labor 06/04/2018   SVD (spontaneous vaginal delivery) 06/04/2018   Postpartum care following vaginal delivery (4/29) 06/04/2018   Second degree perineal laceration 06/04/2018   Rh negative state in antepartum period 11/20/2017   Supervision of normal first pregnancy 11/18/2017   Encounter for well woman exam with routine gynecological exam 09/03/2017    Past Surgical History:  Procedure Laterality Date   PLACEMENT OF BREAST IMPLANTS  08/2023    OB History     Gravida  1   Para  1   Term  1   Preterm  0   AB  0   Living  1      SAB  0   IAB  0   Ectopic  0   Multiple  0   Live Births  1            Home Medications    Prior to Admission medications   Not on File    Family History Family History  Problem Relation Age of Onset    Hypertension Father    Melanoma Mother    Autism Daughter    Cancer Maternal Uncle        lung    Social History Social History   Tobacco Use   Smoking status: Never   Smokeless tobacco: Never  Vaping Use   Vaping status: Never Used  Substance Use Topics   Alcohol use: Not Currently    Comment: wine occ   Drug use: No     Allergies   Patient has no known allergies.   Review of Systems Review of Systems Per HPI  Physical Exam Triage Vital Signs ED Triage Vitals  Encounter Vitals Group     BP 11/05/23 1730 137/83     Girls Systolic BP Percentile --      Girls Diastolic BP Percentile --      Boys Systolic BP Percentile --      Boys Diastolic BP Percentile --      Pulse Rate 11/05/23 1730 78     Resp 11/05/23 1730 18     Temp 11/05/23 1730 97.9 F (36.6 C)     Temp Source 11/05/23 1730 Oral     SpO2 11/05/23 1730 99 %     Weight --      Height --  Head Circumference --      Peak Flow --      Pain Score 11/05/23 1733 2     Pain Loc --      Pain Education --      Exclude from Growth Chart --    No data found.  Updated Vital Signs BP 137/83 (BP Location: Right Arm)   Pulse 78   Temp 97.9 F (36.6 C) (Oral)   Resp 18   LMP 10/16/2023 (Exact Date)   SpO2 99%   Visual Acuity Right Eye Distance:   Left Eye Distance:   Bilateral Distance:    Right Eye Near:   Left Eye Near:    Bilateral Near:     Physical Exam Vitals and nursing note reviewed.  Constitutional:      Appearance: Normal appearance. She is not ill-appearing.  HENT:     Head: Atraumatic.     Mouth/Throat:     Mouth: Mucous membranes are moist.     Pharynx: Oropharynx is clear.  Eyes:     Extraocular Movements: Extraocular movements intact.     Conjunctiva/sclera: Conjunctivae normal.  Cardiovascular:     Rate and Rhythm: Normal rate and regular rhythm.     Heart sounds: Normal heart sounds.  Pulmonary:     Effort: Pulmonary effort is normal.     Breath sounds: Normal  breath sounds. No wheezing or rales.  Musculoskeletal:        General: Tenderness present. Normal range of motion.     Cervical back: Normal range of motion and neck supple.     Comments: Very localized area of tenderness to palpation to the posterior left tricep, no deformities palpable in this area, range of motion and strength intact  Skin:    General: Skin is warm and dry.  Neurological:     Mental Status: She is alert and oriented to person, place, and time.     Motor: No weakness.     Gait: Gait normal.     Comments: Left upper extremity neurovascularly intact  Psychiatric:        Mood and Affect: Mood normal.        Thought Content: Thought content normal.        Judgment: Judgment normal.      UC Treatments / Results  Labs (all labs ordered are listed, but only abnormal results are displayed) Labs Reviewed - No data to display  EKG   Radiology No results found.  Procedures Procedures (including critical care time)  Medications Ordered in UC Medications - No data to display  Initial Impression / Assessment and Plan / UC Course  I have reviewed the triage vital signs and the nursing notes.  Pertinent labs & imaging results that were available during my care of the patient were reviewed by me and considered in my medical decision making (see chart for details).     Vital signs and exam very reassuring today, she is well-appearing and in no acute distress.  Regarding her left arm pain, highly suspicious for musculoskeletal etiology.  Discussed over-the-counter pain relievers, heat, massage, rest.  Regarding her palpitations, normal sinus rhythm today on EKG with no ST elevations but recommended cardiology or PCP follow-up for consideration of an event monitor if this is an ongoing issue.  Her symptoms could possibly be related to anxiety and panic episodes, however follow-up with PCP as soon as possible and go to the emergency department for worsening symptoms at any  time.  Final  Clinical Impressions(s) / UC Diagnoses   Final diagnoses:  Palpitations  Anxiety  Left upper arm pain     Discharge Instructions      Your vital signs and exam are very reassuring today, however we are unable to rule out more emergent causes of your pain in this setting as discussed during your visit.  If your symptoms persist or worsen, I recommend going to the emergency department for further evaluation.  Your arm pain does appear to be more musculoskeletal in nature, I recommend heat, massage, over-the-counter pain relievers and rest.  Regarding your palpitations, possibly anxiety related but do recommend close primary care or cardiology follow-up for further evaluation into this.    ED Prescriptions   None    PDMP not reviewed this encounter.   Stuart Vernell Norris, [place] 11/08/23 1722
